# Patient Record
Sex: Female | Born: 1970 | Race: Black or African American | Hispanic: No | Marital: Single | Smoking: Former smoker
Health system: Southern US, Community
[De-identification: ages and names within clinical notes are randomized; demographics above are authoritative.]

## PROBLEM LIST (undated history)

## (undated) ENCOUNTER — Emergency Department (HOSPITAL_COMMUNITY)

## (undated) DIAGNOSIS — G9340 Encephalopathy, unspecified: Secondary | ICD-10-CM

## (undated) DIAGNOSIS — F209 Schizophrenia, unspecified: Secondary | ICD-10-CM

## (undated) DIAGNOSIS — E876 Hypokalemia: Secondary | ICD-10-CM

## (undated) DIAGNOSIS — F1021 Alcohol dependence, in remission: Secondary | ICD-10-CM

## (undated) DIAGNOSIS — I10 Essential (primary) hypertension: Secondary | ICD-10-CM

## (undated) DIAGNOSIS — R413 Other amnesia: Secondary | ICD-10-CM

## (undated) DIAGNOSIS — K7460 Unspecified cirrhosis of liver: Secondary | ICD-10-CM

## (undated) DIAGNOSIS — E119 Type 2 diabetes mellitus without complications: Secondary | ICD-10-CM

## (undated) DIAGNOSIS — D649 Anemia, unspecified: Secondary | ICD-10-CM

## (undated) HISTORY — PX: HIP SURGERY: SHX245

## (undated) HISTORY — DX: Anemia, unspecified: D64.9

## (undated) HISTORY — DX: Other amnesia: R41.3

---

## 2003-02-12 ENCOUNTER — Emergency Department (HOSPITAL_COMMUNITY): Admission: EM | Admit: 2003-02-12 | Discharge: 2003-02-12 | Payer: Self-pay | Admitting: Emergency Medicine

## 2003-07-01 ENCOUNTER — Emergency Department (HOSPITAL_COMMUNITY): Admission: EM | Admit: 2003-07-01 | Discharge: 2003-07-01 | Payer: Self-pay | Admitting: Emergency Medicine

## 2003-12-31 ENCOUNTER — Other Ambulatory Visit: Admission: RE | Admit: 2003-12-31 | Discharge: 2003-12-31 | Payer: Self-pay

## 2006-07-05 ENCOUNTER — Emergency Department (HOSPITAL_COMMUNITY): Admission: EM | Admit: 2006-07-05 | Discharge: 2006-07-05 | Payer: Self-pay | Admitting: Emergency Medicine

## 2010-01-11 ENCOUNTER — Emergency Department (HOSPITAL_COMMUNITY)
Admission: EM | Admit: 2010-01-11 | Discharge: 2010-01-11 | Payer: Self-pay | Source: Home / Self Care | Admitting: Emergency Medicine

## 2010-01-16 ENCOUNTER — Emergency Department (HOSPITAL_COMMUNITY)
Admission: EM | Admit: 2010-01-16 | Discharge: 2010-01-17 | Payer: Self-pay | Source: Home / Self Care | Admitting: Emergency Medicine

## 2010-03-25 ENCOUNTER — Other Ambulatory Visit: Payer: Self-pay

## 2010-03-25 DIAGNOSIS — Z139 Encounter for screening, unspecified: Secondary | ICD-10-CM

## 2010-03-30 ENCOUNTER — Ambulatory Visit (HOSPITAL_COMMUNITY)
Admission: RE | Admit: 2010-03-30 | Discharge: 2010-03-30 | Disposition: A | Discharge status: Home / Self Care | Payer: Medicaid Other | Source: Ambulatory Visit | Attending: Family Medicine | Admitting: Family Medicine

## 2010-03-30 DIAGNOSIS — Z139 Encounter for screening, unspecified: Secondary | ICD-10-CM

## 2010-03-30 DIAGNOSIS — Z1231 Encounter for screening mammogram for malignant neoplasm of breast: Secondary | ICD-10-CM | POA: Insufficient documentation

## 2010-03-30 LAB — COMPREHENSIVE METABOLIC PANEL WITH GFR
ALT: 63 U/L — ABNORMAL HIGH (ref 0–35)
BUN: <1 mg/dL — ABNORMAL LOW (ref 6–23)
CO2: 26 meq/L (ref 19–32)
Calcium: 8.2 mg/dL — ABNORMAL LOW (ref 8.4–10.5)
Creatinine, Ser: 0.59 mg/dL (ref 0.4–1.2)
GFR calc non Af Amer: >60 mL/min (ref >60)
Glucose, Bld: 93 mg/dL (ref 70–99)
Total Protein: 7.6 g/dL (ref 6.0–8.3)

## 2010-03-30 LAB — CBC
HCT: 36.8 % (ref 36.0–46.0)
Hemoglobin: 13.7 g/dL (ref 12.0–15.0)
MCH: 38.6 pg — ABNORMAL HIGH (ref 26.0–34.0)
MCH: 38.7 pg — ABNORMAL HIGH (ref 26.0–34.0)
MCHC: 37.2 g/dL — ABNORMAL HIGH (ref 30.0–36.0)
MCV: 104 fL — ABNORMAL HIGH (ref 78.0–100.0)
Platelets: 359 10*3/uL (ref 150–400)
Platelets: 397 10*3/uL (ref 150–400)
RBC: 3.37 MIL/uL — ABNORMAL LOW (ref 3.87–5.11)
RBC: 3.54 MIL/uL — ABNORMAL LOW (ref 3.87–5.11)
RDW: 15.6 % — ABNORMAL HIGH (ref 11.5–15.5)
WBC: 9.6 10*3/uL (ref 4.0–10.5)

## 2010-03-30 LAB — URINALYSIS, ROUTINE W REFLEX MICROSCOPIC
Bilirubin Urine: NEGATIVE
Glucose, UA: NEGATIVE mg/dL
Glucose, UA: NEGATIVE mg/dL
Ketones, ur: NEGATIVE mg/dL
Nitrite: NEGATIVE
Nitrite: NEGATIVE
Protein, ur: NEGATIVE mg/dL
Specific Gravity, Urine: 1.02 (ref 1.005–1.030)
Specific Gravity, Urine: <1.005 — ABNORMAL LOW (ref 1.005–1.030)
Urobilinogen, UA: 0.2 mg/dL (ref 0.0–1.0)
pH: 7 (ref 5.0–8.0)
pH: 7 (ref 5.0–8.0)

## 2010-03-30 LAB — DIFFERENTIAL
Basophils Absolute: 0 10*3/uL (ref 0.0–0.1)
Basophils Absolute: 0.1 10*3/uL (ref 0.0–0.1)
Basophils Relative: 0 % (ref 0–1)
Basophils Relative: 1 % (ref 0–1)
Eosinophils Absolute: 0 10*3/uL (ref 0.0–0.7)
Eosinophils Absolute: 0 K/uL (ref 0.0–0.7)
Eosinophils Relative: 0 % (ref 0–5)
Lymphocytes Relative: 32 % (ref 12–46)
Lymphs Abs: 1.7 10*3/uL (ref 0.7–4.0)
Lymphs Abs: 3.1 K/uL (ref 0.7–4.0)
Monocytes Absolute: 0.8 10*3/uL (ref 0.1–1.0)
Monocytes Relative: 8 % (ref 3–12)
Neutro Abs: 5.7 K/uL (ref 1.7–7.7)
Neutrophils Relative %: 59 % (ref 43–77)
Neutrophils Relative %: 75 % (ref 43–77)

## 2010-03-30 LAB — COMPREHENSIVE METABOLIC PANEL
AST: 128 U/L — ABNORMAL HIGH (ref 0–37)
Albumin: 4 g/dL (ref 3.5–5.2)
Alkaline Phosphatase: 62 U/L (ref 39–117)
Chloride: 92 mEq/L — ABNORMAL LOW (ref 96–112)
GFR calc Af Amer: >60 mL/min (ref >60)
Potassium: 2.7 mEq/L — CL (ref 3.5–5.1)
Sodium: 134 mEq/L — ABNORMAL LOW (ref 135–145)
Total Bilirubin: 0.9 mg/dL (ref 0.3–1.2)

## 2010-03-30 LAB — URINE MICROSCOPIC-ADD ON

## 2010-03-30 LAB — BASIC METABOLIC PANEL
CO2: 27 mEq/L (ref 19–32)
Calcium: 8.8 mg/dL (ref 8.4–10.5)
Creatinine, Ser: 0.7 mg/dL (ref 0.4–1.2)
GFR calc Af Amer: >60 mL/min (ref >60)

## 2010-09-28 ENCOUNTER — Encounter: Payer: Self-pay | Admitting: *Deleted

## 2010-09-28 ENCOUNTER — Emergency Department (HOSPITAL_COMMUNITY)
Admission: EM | Admit: 2010-09-28 | Discharge: 2010-09-28 | Disposition: A | Discharge status: Home / Self Care | Payer: Self-pay | Attending: Emergency Medicine | Admitting: Emergency Medicine

## 2010-09-28 DIAGNOSIS — F1010 Alcohol abuse, uncomplicated: Secondary | ICD-10-CM

## 2010-09-28 DIAGNOSIS — E876 Hypokalemia: Secondary | ICD-10-CM

## 2010-09-28 DIAGNOSIS — I10 Essential (primary) hypertension: Secondary | ICD-10-CM | POA: Insufficient documentation

## 2010-09-28 DIAGNOSIS — F17200 Nicotine dependence, unspecified, uncomplicated: Secondary | ICD-10-CM | POA: Insufficient documentation

## 2010-09-28 HISTORY — DX: Essential (primary) hypertension: I10

## 2010-09-28 HISTORY — DX: Hypokalemia: E87.6

## 2010-09-28 LAB — CBC
HCT: 38.1 % (ref 36.0–46.0)
Hemoglobin: 13.6 g/dL (ref 12.0–15.0)
MCHC: 35.7 g/dL (ref 30.0–36.0)
RBC: 3.72 MIL/uL — ABNORMAL LOW (ref 3.87–5.11)

## 2010-09-28 LAB — COMPREHENSIVE METABOLIC PANEL
Albumin: 3.8 g/dL (ref 3.5–5.2)
Alkaline Phosphatase: 56 U/L (ref 39–117)
BUN: 5 mg/dL — ABNORMAL LOW (ref 6–23)
CO2: 35 mEq/L — ABNORMAL HIGH (ref 19–32)
Chloride: 94 mEq/L — ABNORMAL LOW (ref 96–112)
Creatinine, Ser: 0.47 mg/dL — ABNORMAL LOW (ref 0.50–1.10)
GFR calc Af Amer: >60 mL/min (ref >60)
GFR calc non Af Amer: >60 mL/min (ref >60)
Glucose, Bld: 93 mg/dL (ref 70–99)
Potassium: 2.5 mEq/L — CL (ref 3.5–5.1)
Total Bilirubin: 3.5 mg/dL — ABNORMAL HIGH (ref 0.3–1.2)

## 2010-09-28 LAB — DIFFERENTIAL
Basophils Relative: 0 % (ref 0–1)
Lymphs Abs: 2.5 10*3/uL (ref 0.7–4.0)
Monocytes Absolute: 0.5 10*3/uL (ref 0.1–1.0)
Monocytes Relative: 7 % (ref 3–12)
Neutro Abs: 4.4 10*3/uL (ref 1.7–7.7)
Neutrophils Relative %: 59 % (ref 43–77)

## 2010-09-28 LAB — RAPID URINE DRUG SCREEN, HOSP PERFORMED
Barbiturates: NOT DETECTED
Cocaine: NOT DETECTED
Tetrahydrocannabinol: NOT DETECTED

## 2010-09-28 LAB — URINE MICROSCOPIC-ADD ON

## 2010-09-28 LAB — URINALYSIS, ROUTINE W REFLEX MICROSCOPIC
Nitrite: POSITIVE — AB
Specific Gravity, Urine: 1.01 (ref 1.005–1.030)
Urobilinogen, UA: 4 mg/dL — ABNORMAL HIGH (ref 0.0–1.0)

## 2010-09-28 LAB — ETHANOL: Alcohol, Ethyl (B): <11 mg/dL (ref 0–11)

## 2010-09-28 LAB — LIPASE, BLOOD: Lipase: 41 U/L (ref 11–59)

## 2010-09-28 MED ORDER — THIAMINE HCL 100 MG/ML IJ SOLN
Freq: Once | INTRAVENOUS | Status: DC
  Filled 2010-09-28: qty 1000

## 2010-09-28 MED ORDER — ONE-DAILY MULTI VITAMINS PO TABS: 1.0000 | ORAL_TABLET | Freq: Every day | ORAL | Status: DC

## 2010-09-28 MED ORDER — POTASSIUM CHLORIDE 20 MEQ PO PACK
40.0000 meq | PACK | Freq: Once | ORAL | Status: AC
  Administered 2010-09-28: 40 meq via ORAL
  Filled 2010-09-28: qty 2

## 2010-09-28 MED ORDER — POTASSIUM CHLORIDE ER 10 MEQ PO TBCR: 20.0000 meq | EXTENDED_RELEASE_TABLET | Freq: Two times a day (BID) | ORAL | Status: DC

## 2010-09-28 MED ORDER — LORAZEPAM 2 MG/ML IJ SOLN
1.0000 mg | Freq: Once | INTRAMUSCULAR | Status: AC
  Administered 2010-09-28: 1 mg via INTRAVENOUS
  Filled 2010-09-28: qty 1

## 2010-09-28 NOTE — ED Notes (Signed)
Numbness/tingling in both legs and states last night burning sensation "shot down spine".  Also c/o twitching.  States is stressed due to Grandmother being in hospice and has started drinking heavily.

## 2010-09-28 NOTE — ED Notes (Signed)
Pt ate and states she feels better. Pt states she is starting to feel drowsy. VSS. NAD at this time.

## 2010-09-28 NOTE — ED Provider Notes (Signed)
History   Chart scribed for Carleene Cooper III, MD by Enos Fling; the patient was seen in room APA19/APA19; this patient's care was started at 12:55 PM.    CSN: [id number] Arrival date & time: 09/28/2010 11:17 AM  Chief Complaint  Patient presents with  . Tingling   HPI Melinda Giles is a 40 y.o. female who presents to the Emergency Department complaining of nausea. Pt reports waking with nausea Saturday morning (2 days ago), much worse last night with visual hallucinations and feeling things are crawling on her. This has persisted today. Also c/o twitching as well as numbness and tingling in toes and hands since last night. Pt normally drinks 1/5 liquor every 2 days but has not had anything to drink since Friday night (3 days ago). Family report pt under a lot of stress d/t grandmother being in hospice.  Also reports HTN meds (atenolol) usually make her dizzy and PCP recently increased dose.    Past Medical History  Diagnosis Date  . Hypertension   . Hypokalemia     History reviewed. No pertinent past surgical history.  No family history on file.  History  Substance Use Topics  . Smoking status: Current Everyday Smoker  . Smokeless tobacco: Not on file  . Alcohol Use: Yes     drinks  1/5 of liquor every two days.  Smoker 0.5 ppd  OB History    Grav Para Term Preterm Abortions TAB SAB Ect Mult Living                 Previous Medications   ATENOLOL (TENORMIN) 50 MG TABLET    Take 50 mg by mouth daily.     CALCIUM-VITAMIN D (OSCAL WITH D) 500-200 MG-UNIT PER TABLET    Take 1 tablet by mouth daily.     LORATADINE (CLARITIN) 10 MG TABLET    Take 10 mg by mouth daily.     NORETHINDRONE (MICRONOR,CAMILA,ERRIN) 0.35 MG TABLET    Take 1 tablet by mouth daily.       Allergies as of 09/28/2010  . (No Known Allergies)     Review of Systems  Constitutional: Negative for fever, appetite change and unexpected weight change.  HENT: Negative for ear pain and sore throat.     Eyes: Negative for visual disturbance.  Respiratory: Negative for cough.   Cardiovascular: Negative for chest pain.  Gastrointestinal: Positive for nausea. Negative for vomiting, abdominal pain and diarrhea.  Genitourinary: Negative.   Musculoskeletal: Negative for myalgias.  Skin: Negative for rash.  Neurological: Positive for dizziness and numbness. Negative for seizures and syncope.  Hematological: Negative.   Psychiatric/Behavioral: Positive for hallucinations. The patient is nervous/anxious.     Physical Exam  BP 153/98  Pulse 87  Temp(Src) 98.9 F (37.2 C) (Oral)  Resp 19  Ht 5\' 4"  (1.626 m)  Wt 174 lb (78.926 kg)  BMI 29.87 kg/m2  SpO2 99%  Physical Exam  Nursing note and vitals reviewed. Constitutional: She is oriented to person, place, and time. She appears well-developed and well-nourished. No distress.  HENT:  Head: Normocephalic.  Mouth/Throat: Mucous membranes are normal.  Eyes: Conjunctivae are normal.  Neck: Normal range of motion. Neck supple.  Cardiovascular: Normal rate, regular rhythm and intact distal pulses.  Exam reveals no gallop and no friction rub.   No murmur heard. Pulmonary/Chest: Effort normal and breath sounds normal. She has no wheezes. She has no rales.  Abdominal: Soft. There is no tenderness.  Musculoskeletal: Normal range of  motion. She exhibits no edema and no tenderness.  Neurological: She is alert and oriented to person, place, and time.       Motor strength and extremity sensation intact  Skin: Skin is warm and dry. No rash noted.  Psychiatric: She has a normal mood and affect.    Procedures - none  OTHER DATA REVIEWED: Nursing notes and vital signs reviewed. Prior records reviewed.   LABS / RADIOLOGY: Results for orders placed during the hospital encounter of 09/28/10  CBC      Component Value Range   WBC 7.4  4.0 - 10.5 (K/uL)   RBC 3.72 (*) 3.87 - 5.11 (MIL/uL)   Hemoglobin 13.6  12.0 - 15.0 (g/dL)   HCT 03.4  74.2 -  59.5 (%)   MCV 102.4 (*) 78.0 - 100.0 (fL)   MCH 36.6 (*) 26.0 - 34.0 (pg)   MCHC 35.7  30.0 - 36.0 (g/dL)   RDW 63.8  75.6 - 43.3 (%)   Platelets 139 (*) 150 - 400 (K/uL)  DIFFERENTIAL      Component Value Range   Neutrophils Relative 59  43 - 77 (%)   Neutro Abs 4.4  1.7 - 7.7 (K/uL)   Lymphocytes Relative 33  12 - 46 (%)   Lymphs Abs 2.5  0.7 - 4.0 (K/uL)   Monocytes Relative 7  3 - 12 (%)   Monocytes Absolute 0.5  0.1 - 1.0 (K/uL)   Eosinophils Relative 1  0 - 5 (%)   Eosinophils Absolute 0.0  0.0 - 0.7 (K/uL)   Basophils Relative 0  0 - 1 (%)   Basophils Absolute 0.0  0.0 - 0.1 (K/uL)  COMPREHENSIVE METABOLIC PANEL      Component Value Range   Sodium 139  135 - 145 (mEq/L)   Potassium 2.5 (*) 3.5 - 5.1 (mEq/L)   Chloride 94 (*) 96 - 112 (mEq/L)   CO2 35 (*) 19 - 32 (mEq/L)   Glucose, Bld 93  70 - 99 (mg/dL)   BUN 5 (*) 6 - 23 (mg/dL)   Creatinine, Ser 2.95 (*) 0.50 - 1.10 (mg/dL)   Calcium 9.4  8.4 - 18.8 (mg/dL)   Total Protein 7.2  6.0 - 8.3 (g/dL)   Albumin 3.8  3.5 - 5.2 (g/dL)   AST 71 (*) 0 - 37 (U/L)   ALT 31  0 - 35 (U/L)   Alkaline Phosphatase 56  39 - 117 (U/L)   Total Bilirubin 3.5 (*) 0.3 - 1.2 (mg/dL)   GFR calc non Af Amer >60  >60 (mL/min)   GFR calc Af Amer >60  >60 (mL/min)  LIPASE, BLOOD      Component Value Range   Lipase 41  11 - 59 (U/L)  ETHANOL      Component Value Range   Alcohol, Ethyl (B) <11  0 - 11 (mg/dL)  URINALYSIS, ROUTINE W REFLEX MICROSCOPIC      Component Value Range   Color, Urine AMBER (*) YELLOW    Appearance CLEAR  CLEAR    Specific Gravity, Urine 1.010  1.005 - 1.030    pH 7.0  5.0 - 8.0    Glucose, UA 100 (*) NEGATIVE (mg/dL)   Hgb urine dipstick SMALL (*) NEGATIVE    Bilirubin Urine MODERATE (*) NEGATIVE    Ketones, ur TRACE (*) NEGATIVE (mg/dL)   Protein, ur 30 (*) NEGATIVE (mg/dL)   Urobilinogen, UA 4.0 (*) 0.0 - 1.0 (mg/dL)   Nitrite POSITIVE (*) NEGATIVE  Leukocytes, UA TRACE (*) NEGATIVE   URINE RAPID DRUG  SCREEN (HOSP PERFORMED)      Component Value Range   Opiates NONE DETECTED  NONE DETECTED    Cocaine NONE DETECTED  NONE DETECTED    Benzodiazepines NONE DETECTED  NONE DETECTED    Amphetamines NONE DETECTED  NONE DETECTED    Tetrahydrocannabinol NONE DETECTED  NONE DETECTED    Barbiturates NONE DETECTED  NONE DETECTED   URINE MICROSCOPIC-ADD ON      Component Value Range   Squamous Epithelial / LPF MANY (*) RARE    WBC, UA 7-10  <3 (WBC/hpf)   RBC / HPF 3-6  <3 (RBC/hpf)   Bacteria, UA FEW (*) RARE     ED COURSE: Patient had physical exam and laboratory testing, which showed hypokalemia, and mildly elevated liver function tests. I advised her that she would need to take potassium every day, and ordered 40 mEq of potassium orally now. She also needs to stop drinking, which is the source of her illness. She can take multivitamins every day. She did not want inpatient treatment for alcohol abuse. I referred her to [facility], for outpatient therapy.  IMPRESSION: 1. Alcohol abuse   2. Hypokalemia      PLAN:  All results reviewed and discussed with pt, questions answered, pt agreeable with plan.    MEDS GIVEN IN ED: sodium chloride 0.9 % 1,000 mL with thiamine 100 mg, folic acid 1 mg, multivitamins adult 10 mL infusion (  Intravenous New Bag 09/28/10 1418)  LORazepam (ATIVAN) injection 1 mg (1 mg Intravenous Given 09/28/10 1418)     SCRIBE ATTESTATION: I personally performed the services described in this documentation, which was scribed in my presence. The recorded information has been reviewed and considered. Osvaldo Human, MD        Carleene Cooper III, MD 09/28/10 [phone]

## 2011-08-06 ENCOUNTER — Encounter (HOSPITAL_COMMUNITY): Payer: Self-pay | Admitting: *Deleted

## 2011-08-06 ENCOUNTER — Emergency Department (HOSPITAL_COMMUNITY)
Admission: EM | Admit: 2011-08-06 | Discharge: 2011-08-06 | Disposition: A | Discharge status: Home / Self Care | Payer: Self-pay | Attending: Emergency Medicine | Admitting: Emergency Medicine

## 2011-08-06 DIAGNOSIS — Z79899 Other long term (current) drug therapy: Secondary | ICD-10-CM | POA: Insufficient documentation

## 2011-08-06 DIAGNOSIS — I10 Essential (primary) hypertension: Secondary | ICD-10-CM | POA: Insufficient documentation

## 2011-08-06 DIAGNOSIS — F1010 Alcohol abuse, uncomplicated: Secondary | ICD-10-CM | POA: Insufficient documentation

## 2011-08-06 DIAGNOSIS — E876 Hypokalemia: Secondary | ICD-10-CM | POA: Insufficient documentation

## 2011-08-06 DIAGNOSIS — Z046 Encounter for general psychiatric examination, requested by authority: Secondary | ICD-10-CM | POA: Insufficient documentation

## 2011-08-06 DIAGNOSIS — F17200 Nicotine dependence, unspecified, uncomplicated: Secondary | ICD-10-CM | POA: Insufficient documentation

## 2011-08-06 LAB — CBC WITH DIFFERENTIAL/PLATELET
Eosinophils Absolute: 0 10*3/uL (ref 0.0–0.7)
Lymphocytes Relative: 27 % (ref 12–46)
Lymphs Abs: 1.6 10*3/uL (ref 0.7–4.0)
MCH: 36.4 pg — ABNORMAL HIGH (ref 26.0–34.0)
Neutro Abs: 3.8 10*3/uL (ref 1.7–7.7)
Neutrophils Relative %: 63 % (ref 43–77)
Platelets: 104 10*3/uL — ABNORMAL LOW (ref 150–400)
RBC: 3.52 MIL/uL — ABNORMAL LOW (ref 3.87–5.11)
WBC: 6 10*3/uL (ref 4.0–10.5)

## 2011-08-06 LAB — BASIC METABOLIC PANEL
Chloride: 100 mEq/L (ref 96–112)
GFR calc Af Amer: >90 mL/min (ref >90)
GFR calc non Af Amer: >90 mL/min (ref >90)
Glucose, Bld: 98 mg/dL (ref 70–99)
Potassium: 2.5 mEq/L — CL (ref 3.5–5.1)
Sodium: 138 mEq/L (ref 135–145)

## 2011-08-06 LAB — URINALYSIS, ROUTINE W REFLEX MICROSCOPIC
Nitrite: NEGATIVE
Specific Gravity, Urine: <1.005 — ABNORMAL LOW (ref 1.005–1.030)
Urobilinogen, UA: 0.2 mg/dL (ref 0.0–1.0)
pH: 6 (ref 5.0–8.0)

## 2011-08-06 LAB — RAPID URINE DRUG SCREEN, HOSP PERFORMED
Amphetamines: NOT DETECTED
Barbiturates: NOT DETECTED
Benzodiazepines: NOT DETECTED

## 2011-08-06 LAB — POCT PREGNANCY, URINE: Preg Test, Ur: NEGATIVE

## 2011-08-06 LAB — ETHANOL: Alcohol, Ethyl (B): <11 mg/dL (ref 0–11)

## 2011-08-06 MED ORDER — POTASSIUM CHLORIDE CRYS ER 20 MEQ PO TBCR
40.0000 meq | EXTENDED_RELEASE_TABLET | Freq: Once | ORAL | Status: AC
  Administered 2011-08-06: 40 meq via ORAL
  Filled 2011-08-06: qty 2

## 2011-08-06 MED ORDER — POTASSIUM CHLORIDE CRYS ER 20 MEQ PO TBCR: 20.0000 meq | EXTENDED_RELEASE_TABLET | Freq: Two times a day (BID) | ORAL | Status: DC

## 2011-08-06 MED ORDER — POTASSIUM CHLORIDE 10 MEQ/100ML IV SOLN
10.0000 meq | Freq: Once | INTRAVENOUS | Status: AC
  Administered 2011-08-06: 10 meq via INTRAVENOUS
  Filled 2011-08-06: qty 100

## 2011-08-06 NOTE — ED Notes (Signed)
IVC here with police.  With papers.  Alert, cooperative.

## 2011-08-06 NOTE — ED Notes (Signed)
Pt very tearful and will not answer any questions.

## 2011-08-06 NOTE — ED Provider Notes (Signed)
History  This chart was scribed for Dione Booze, MD by Gerlean Ren. This patient was seen in room APA16A/APA16A and the patient's care was started at 2:06PM.  CSN: [id number]  Arrival date & time 08/06/11  1320   First MD Initiated Contact with Patient 08/06/11 1406      Chief Complaint  Patient presents with  . Medical Clearance     The history is provided by the patient. No language interpreter was used.    Melinda Giles is a 41 y.o. female who presents to the Emergency Department in the custody of RDCP for IVC for EtOH detox.  IVC papers state that mother wants pt committed for help with EtOH detox because she has been driving while intoxicated. Pt reports she consumes one pint in 3 days and states that her last alcoholic beverage was on 07/31/11. Pt states that she does not currently want help for alcohol abuse. She reports depression and reduced appetite, but denies suicidal thought or hallucinations. She also states she called [facility] to get outpatient help and was told that she would be contact within the next few days. She denies fever, neck pain, sore throat, visual disturbance, CP, cough, SOB, abdominal pain, nausea, emesis, diarrhea, urinary symptoms, back pain, HA, weakness, numbness and rash as associated symptoms.  She has h/o HTN. Pt reports tobacco use (.5pack/day).  Pt denies drug use.  Past Medical History  Diagnosis Date  . Hypertension   . Hypokalemia     History reviewed. No pertinent past surgical history.  History reviewed. No pertinent family history.  History  Substance Use Topics  . Smoking status: Current Everyday Smoker  . Smokeless tobacco: Not on file  . Alcohol Use: Yes     drinks  1/5 of liquor every two days.    No OB history provided.  Review of Systems  Psychiatric/Behavioral: Positive for disturbed wake/sleep cycle. Negative for suicidal ideas, hallucinations and self-injury.  All other systems reviewed and are negative.    Allergies    Other  Home Medications   Current Outpatient Rx  Name Route Sig Dispense Refill  . ATENOLOL 50 MG PO TABS Oral Take 50 mg by mouth daily.      Marland Kitchen CALCIUM CARBONATE-VITAMIN D 500-200 MG-UNIT PO TABS Oral Take 1 tablet by mouth daily.      Marland Kitchen LORATADINE 10 MG PO TABS Oral Take 10 mg by mouth daily.      Marland Kitchen ONE-DAILY MULTI VITAMINS PO TABS Oral Take 1 tablet by mouth daily. 30 tablet 2  . NORETHINDRONE 0.35 MG PO TABS Oral Take 1 tablet by mouth daily.      Marland Kitchen POTASSIUM CHLORIDE ER 10 MEQ PO TBCR Oral Take 2 tablets (20 mEq total) by mouth 2 (two) times daily. 30 tablet 0    Triage vitals: BP 145/105  Pulse 87  Temp 99.8 F (37.7 C) (Oral)  Resp 20  Ht 5\' 5"  (1.651 m)  Wt 170 lb (77.111 kg)  BMI 28.29 kg/m2  SpO2 100%  Physical Exam  Constitutional: She is oriented to person, place, and time. She appears well-developed and well-nourished.  HENT:  Head: Normocephalic and atraumatic.  Neck: Normal range of motion. No tracheal deviation present.  Cardiovascular: Normal rate, regular rhythm and normal heart sounds.   Pulmonary/Chest: Effort normal and breath sounds normal. No respiratory distress.  Abdominal: Soft. Bowel sounds are normal. There is no tenderness.  Musculoskeletal: Normal range of motion.  Neurological: She is alert and oriented to  person, place, and time.  Skin: Skin is warm and dry.  Psychiatric: She has a normal mood and affect. Her behavior is normal.    ED Course  Procedures (including critical care time)  DIAGNOSTIC STUDIES: Oxygen Saturation is 100% on room air, normal by my interpretation.    COORDINATION OF CARE: 2:16PM- Described need for blood work and urinalysis, but further described counseling options for alcohol problems. Informed pt that she does not meet requirement for IVC.  Results for orders placed during the hospital encounter of 08/06/11  CBC WITH DIFFERENTIAL      Component Value Range   WBC 6.0  4.0 - 10.5 K/uL   RBC 3.52 (*) 3.87 -  5.11 MIL/uL   Hemoglobin 12.8  12.0 - 15.0 g/dL   HCT 16.1  09.6 - 04.5 %   MCV 102.3 (*) 78.0 - 100.0 fL   MCH 36.4 (*) 26.0 - 34.0 pg   MCHC 35.6  30.0 - 36.0 g/dL   RDW 40.9  81.1 - 91.4 %   Platelets 104 (*) 150 - 400 K/uL   Neutrophils Relative 63  43 - 77 %   Neutro Abs 3.8  1.7 - 7.7 K/uL   Lymphocytes Relative 27  12 - 46 %   Lymphs Abs 1.6  0.7 - 4.0 K/uL   Monocytes Relative 9  3 - 12 %   Monocytes Absolute 0.5  0.1 - 1.0 K/uL   Eosinophils Relative 1  0 - 5 %   Eosinophils Absolute 0.0  0.0 - 0.7 K/uL   Basophils Relative 1  0 - 1 %   Basophils Absolute 0.0  0.0 - 0.1 K/uL  BASIC METABOLIC PANEL      Component Value Range   Sodium 138  135 - 145 mEq/L   Potassium 2.5 (*) 3.5 - 5.1 mEq/L   Chloride 100  96 - 112 mEq/L   CO2 29  19 - 32 mEq/L   Glucose, Bld 98  70 - 99 mg/dL   BUN 3 (*) 6 - 23 mg/dL   Creatinine, Ser 7.82  0.50 - 1.10 mg/dL   Calcium 9.4  8.4 - 95.6 mg/dL   GFR calc non Af Amer >90  >90 mL/min   GFR calc Af Amer >90  >90 mL/min  URINALYSIS, ROUTINE W REFLEX MICROSCOPIC      Component Value Range   Color, Urine YELLOW  YELLOW   APPearance CLEAR  CLEAR   Specific Gravity, Urine <1.005 (*) 1.005 - 1.030   pH 6.0  5.0 - 8.0   Glucose, UA NEGATIVE  NEGATIVE mg/dL   Hgb urine dipstick TRACE (*) NEGATIVE   Bilirubin Urine NEGATIVE  NEGATIVE   Ketones, ur NEGATIVE  NEGATIVE mg/dL   Protein, ur NEGATIVE  NEGATIVE mg/dL   Urobilinogen, UA 0.2  0.0 - 1.0 mg/dL   Nitrite NEGATIVE  NEGATIVE   Leukocytes, UA NEGATIVE  NEGATIVE  ETHANOL      Component Value Range   Alcohol, Ethyl (B) <11  0 - 11 mg/dL  POCT PREGNANCY, URINE      Component Value Range   Preg Test, Ur NEGATIVE  NEGATIVE  URINE MICROSCOPIC-ADD ON      Component Value Range   Squamous Epithelial / LPF MANY (*) RARE   WBC, UA 0-2  <3 WBC/hpf   RBC / HPF 0-2  <3 RBC/hpf   Bacteria, UA FEW (*) RARE  URINE RAPID DRUG SCREEN (HOSP PERFORMED)      Component Value  Range   Opiates NONE  DETECTED  NONE DETECTED   Cocaine NONE DETECTED  NONE DETECTED   Benzodiazepines NONE DETECTED  NONE DETECTED   Amphetamines NONE DETECTED  NONE DETECTED   Tetrahydrocannabinol NONE DETECTED  NONE DETECTED   Barbiturates NONE DETECTED  NONE DETECTED     1. Alcohol abuse   2. Hypokalemia       MDM  Alcohol abuse. I reviewed the involuntary commitment papers. She does not meet any of the criteria for involuntary commitment. She has no suicidal or homicidal ideation. Although she has done some things which are potentially self-destructive, she is not an imminent threat to herself. She denies any desire to go through alcohol detox at this time. ACT Team will be consulted to give resources for her should she ever desire to go through detox.   Potassium is come back very low. She will be given IV and oral potassium. Curiously, and she was noted to have a very low potassium in the past. She is not on any diuretics, I do not see any evidence of renal tubular acidosis. For now, potassium will be added treated with potassium supplements, but she may need further workup of this.  I personally performed the services described in this documentation, which was scribed in my presence. The recorded information has been reviewed and considered.  ACT Team states that her only option, given her insurance status, is to followup with [facility].  Dione Booze, MD 08/06/11 1726

## 2011-08-06 NOTE — BH Assessment (Signed)
Assessment Note   Melinda Giles is an 41 y.o. female. PT WAS BROUGHT IN BY [facility] AFTER PT WAS PETITIONED BY HER MOM DUE TO HER DRINKING. PT DENIES ANY IDEATION & HAS EXPRESSED THAT SHE WAS NOT READY TO GET TX. PT'S PETITION WAS RESINDED & PT WAS DISCHARGED HOME TO FOLLOW UP WITH [facility] IN [place]. PT IS ABLE TO CONTRACT FOR SAFETY.  Axis I: Alcohol Abuse and Substance Induced Mood Disorder Axis II: Deferred Axis III:  Past Medical History  Diagnosis Date  . Hypertension   . Hypokalemia    Axis IV: other psychosocial or environmental problems, problems related to social environment and problems with primary support group Axis V: 51-60 moderate symptoms  Past Medical History:  Past Medical History  Diagnosis Date  . Hypertension   . Hypokalemia     History reviewed. No pertinent past surgical history.  Family History: History reviewed. No pertinent family history.  Social History:  reports that she has been smoking.  She does not have any smokeless tobacco history on file. She reports that she drinks alcohol. She reports that she does not use illicit drugs.  Additional Social History:     CIWA: CIWA-Ar BP: 145/105 mmHg Pulse Rate: 87  COWS:    Allergies:  Allergies  Allergen Reactions  . Other Anaphylaxis and Swelling    "pril " family of bp meds    Home Medications:  (Not in a hospital admission)  OB/GYN Status:  No LMP recorded. Patient is not currently having periods (Reason: Oral contraceptives).  General Assessment Data Location of Assessment: [facility] ACT Assessment: Yes Living Arrangements: Parent Can pt return to current living arrangement?: Yes Admission Status: Involuntary Is patient capable of signing voluntary admission?: Yes Transfer from: Acute Hospital Referral Source: MD     Risk to self Suicidal Ideation: No Suicidal Intent: No Is patient at risk for suicide?: No Suicidal Plan?: No Access to Means: No What has been your use of  drugs/alcohol within the last 12 months?: PT ADMIT TO ABUSING ETOH & LAST USE WAS TODAY Previous Attempts/Gestures: No How many times?: 0  Other Self Harm Risks: SELF MEDICATING Triggers for Past Attempts: Family contact;Unpredictable Intentional Self Injurious Behavior: None Family Suicide History: No Recent stressful life event(s): Conflict (Comment);Financial Problems;Turmoil (Comment) Persecutory voices/beliefs?: No Depression: Yes Depression Symptoms: Loss of interest in usual pleasures Substance abuse history and/or treatment for substance abuse?: Yes Suicide prevention information given to non-admitted patients: Not applicable  Risk to Others Homicidal Ideation: No Thoughts of Harm to Others: No Current Homicidal Intent: No Current Homicidal Plan: No Access to Homicidal Means: No Identified Victim: NA History of harm to others?: No Assessment of Violence: None Noted Violent Behavior Description: COOPERATIVE, ANXIOUS Does patient have access to weapons?: No Criminal Charges Pending?: No Does patient have a court date: No  Psychosis Hallucinations: None noted Delusions: None noted  Mental Status Report Appear/Hygiene: Disheveled;Body odor;Poor hygiene Eye Contact: Fair Motor Activity: Freedom of movement Speech: Logical/coherent Level of Consciousness: Alert Mood: Depressed;Anxious;Anhedonia Affect: Appropriate to circumstance Anxiety Level: Minimal Thought Processes: Coherent;Relevant Judgement: Impaired Orientation: Person;Place;Time;Situation Obsessive Compulsive Thoughts/Behaviors: None  Cognitive Functioning Concentration: Decreased Memory: Recent Intact;Remote Intact IQ: Average Insight: Poor Impulse Control: Poor Appetite: Poor Weight Loss: 0  Weight Gain: 0  Sleep: Decreased Total Hours of Sleep: 2  Vegetative Symptoms: None  ADLScreening [facility] Assessment Services) Patient's cognitive ability adequate to safely complete daily activities?:  Yes Patient able to express need for assistance with ADLs?: Yes  Independently performs ADLs?: Yes  Abuse/Neglect [facility]) Physical Abuse: Denies Verbal Abuse: Denies Sexual Abuse: Denies  Prior Inpatient Therapy Prior Inpatient Therapy: No Prior Therapy Dates: NA Prior Therapy Facilty/Provider(s): NA Reason for Treatment: NA  Prior Outpatient Therapy Prior Outpatient Therapy: No Prior Therapy Dates: NA Prior Therapy Facilty/Provider(s): NA Reason for Treatment: NA  ADL Screening (condition at time of admission) Patient's cognitive ability adequate to safely complete daily activities?: Yes Patient able to express need for assistance with ADLs?: Yes Independently performs ADLs?: Yes       Abuse/Neglect Assessment (Assessment to be complete while patient is alone) Physical Abuse: Denies Verbal Abuse: Denies Sexual Abuse: Denies Values / Beliefs Cultural Requests During Hospitalization: None Spiritual Requests During Hospitalization: None        Additional Information 1:1 In Past 12 Months?: No Elopement Risk: No Does patient have medical clearance?: Yes     Disposition:  Disposition Disposition of Patient: Outpatient treatment Type of outpatient treatment: Adult  On Site Evaluation by:   Reviewed with Physician:     Waldron Session 08/06/2011 8:39 PM

## 2011-08-06 NOTE — ED Notes (Signed)
CRITICAL VALUE ALERT  Critical value received:  Potassium 2.5  Date of notification:  08/06/2011  Time of notification:  1534  Critical value read back: yes  Nurse who received alert: Garrison Columbus, RN  MD notified (1st page): Dr. Preston Fleeting  Time of first page: 1534  MD notified (2nd page):  Time of second page:  Responding MD:  Dr. Preston Fleeting   Time MD responded:  [phone]

## 2011-10-29 ENCOUNTER — Other Ambulatory Visit (HOSPITAL_COMMUNITY): Payer: Self-pay | Admitting: Nurse Practitioner

## 2011-10-29 DIAGNOSIS — Z139 Encounter for screening, unspecified: Secondary | ICD-10-CM

## 2011-11-04 ENCOUNTER — Ambulatory Visit (HOSPITAL_COMMUNITY)
Admission: RE | Admit: 2011-11-04 | Discharge: 2011-11-04 | Disposition: A | Discharge status: Home / Self Care | Payer: Self-pay | Source: Ambulatory Visit | Attending: Nurse Practitioner | Admitting: Nurse Practitioner

## 2011-11-04 DIAGNOSIS — Z139 Encounter for screening, unspecified: Secondary | ICD-10-CM

## 2013-02-15 ENCOUNTER — Other Ambulatory Visit (HOSPITAL_COMMUNITY): Payer: Self-pay | Admitting: *Deleted

## 2013-02-15 DIAGNOSIS — Z1231 Encounter for screening mammogram for malignant neoplasm of breast: Secondary | ICD-10-CM

## 2013-02-26 ENCOUNTER — Ambulatory Visit (HOSPITAL_COMMUNITY)
Admission: RE | Admit: 2013-02-26 | Discharge: 2013-02-26 | Disposition: A | Discharge status: Home / Self Care | Payer: Self-pay | Source: Ambulatory Visit | Attending: *Deleted | Admitting: *Deleted

## 2013-02-26 DIAGNOSIS — Z1231 Encounter for screening mammogram for malignant neoplasm of breast: Secondary | ICD-10-CM

## 2014-11-19 ENCOUNTER — Other Ambulatory Visit (HOSPITAL_COMMUNITY): Payer: Self-pay | Admitting: *Deleted

## 2014-11-19 DIAGNOSIS — Z1231 Encounter for screening mammogram for malignant neoplasm of breast: Secondary | ICD-10-CM

## 2014-12-02 ENCOUNTER — Ambulatory Visit (HOSPITAL_COMMUNITY)
Admission: RE | Admit: 2014-12-02 | Discharge: 2014-12-02 | Disposition: A | Discharge status: Home / Self Care | Payer: PRIVATE HEALTH INSURANCE | Source: Ambulatory Visit | Attending: *Deleted | Admitting: *Deleted

## 2014-12-02 DIAGNOSIS — Z1231 Encounter for screening mammogram for malignant neoplasm of breast: Secondary | ICD-10-CM

## 2015-03-13 ENCOUNTER — Ambulatory Visit: Payer: Self-pay | Admitting: Physician Assistant

## 2015-03-17 ENCOUNTER — Encounter: Payer: Self-pay | Admitting: Physician Assistant

## 2015-03-18 ENCOUNTER — Encounter: Payer: Self-pay | Admitting: Physician Assistant

## 2015-12-01 ENCOUNTER — Other Ambulatory Visit (HOSPITAL_COMMUNITY): Payer: Self-pay | Admitting: Nurse Practitioner

## 2015-12-01 DIAGNOSIS — Z1231 Encounter for screening mammogram for malignant neoplasm of breast: Secondary | ICD-10-CM

## 2015-12-19 ENCOUNTER — Encounter (HOSPITAL_COMMUNITY): Payer: Self-pay | Admitting: Radiology

## 2015-12-19 ENCOUNTER — Ambulatory Visit (HOSPITAL_COMMUNITY)
Admission: RE | Admit: 2015-12-19 | Discharge: 2015-12-19 | Disposition: A | Discharge status: Home / Self Care | Payer: Self-pay | Source: Ambulatory Visit | Attending: Nurse Practitioner | Admitting: Nurse Practitioner

## 2015-12-19 DIAGNOSIS — Z1231 Encounter for screening mammogram for malignant neoplasm of breast: Secondary | ICD-10-CM

## 2016-12-21 ENCOUNTER — Other Ambulatory Visit: Payer: Self-pay | Admitting: Nurse Practitioner

## 2016-12-21 DIAGNOSIS — Z1231 Encounter for screening mammogram for malignant neoplasm of breast: Secondary | ICD-10-CM

## 2017-03-17 ENCOUNTER — Other Ambulatory Visit (HOSPITAL_COMMUNITY): Payer: Self-pay | Admitting: *Deleted

## 2017-03-17 DIAGNOSIS — Z1231 Encounter for screening mammogram for malignant neoplasm of breast: Secondary | ICD-10-CM

## 2017-03-23 ENCOUNTER — Ambulatory Visit (HOSPITAL_COMMUNITY)
Admission: RE | Admit: 2017-03-23 | Discharge: 2017-03-23 | Disposition: A | Discharge status: Home / Self Care | Payer: PRIVATE HEALTH INSURANCE | Source: Ambulatory Visit | Attending: *Deleted | Admitting: *Deleted

## 2017-03-23 DIAGNOSIS — Z1231 Encounter for screening mammogram for malignant neoplasm of breast: Secondary | ICD-10-CM | POA: Diagnosis present

## 2017-10-15 ENCOUNTER — Other Ambulatory Visit: Payer: Self-pay

## 2017-10-15 ENCOUNTER — Emergency Department (HOSPITAL_COMMUNITY)
Admission: EM | Admit: 2017-10-15 | Discharge: 2017-10-15 | Disposition: A | Discharge status: Home / Self Care | Payer: Medicaid Other | Attending: Emergency Medicine | Admitting: Emergency Medicine

## 2017-10-15 ENCOUNTER — Encounter (HOSPITAL_COMMUNITY): Payer: Self-pay | Admitting: Emergency Medicine

## 2017-10-15 DIAGNOSIS — F17210 Nicotine dependence, cigarettes, uncomplicated: Secondary | ICD-10-CM | POA: Insufficient documentation

## 2017-10-15 DIAGNOSIS — Z79899 Other long term (current) drug therapy: Secondary | ICD-10-CM | POA: Insufficient documentation

## 2017-10-15 DIAGNOSIS — E876 Hypokalemia: Secondary | ICD-10-CM | POA: Insufficient documentation

## 2017-10-15 DIAGNOSIS — R202 Paresthesia of skin: Secondary | ICD-10-CM

## 2017-10-15 DIAGNOSIS — Z91010 Allergy to peanuts: Secondary | ICD-10-CM | POA: Insufficient documentation

## 2017-10-15 DIAGNOSIS — I10 Essential (primary) hypertension: Secondary | ICD-10-CM | POA: Insufficient documentation

## 2017-10-15 LAB — MAGNESIUM: MAGNESIUM: 1.4 mg/dL — AB (ref 1.7–2.4)

## 2017-10-15 LAB — COMPREHENSIVE METABOLIC PANEL
ALBUMIN: 3.1 g/dL — AB (ref 3.5–5.0)
ALT: 33 U/L (ref 0–44)
ANION GAP: 10 (ref 5–15)
AST: 77 U/L — AB (ref 15–41)
Alkaline Phosphatase: 69 U/L (ref 38–126)
CHLORIDE: 93 mmol/L — AB (ref 98–111)
CO2: 29 mmol/L (ref 22–32)
Calcium: 8.6 mg/dL — ABNORMAL LOW (ref 8.9–10.3)
Creatinine, Ser: 0.72 mg/dL (ref 0.44–1.00)
GFR calc Af Amer: >60 mL/min (ref >60)
GLUCOSE: 150 mg/dL — AB (ref 70–99)
POTASSIUM: 3.1 mmol/L — AB (ref 3.5–5.1)
Sodium: 132 mmol/L — ABNORMAL LOW (ref 135–145)
Total Bilirubin: 1.8 mg/dL — ABNORMAL HIGH (ref 0.3–1.2)
Total Protein: 8.2 g/dL — ABNORMAL HIGH (ref 6.5–8.1)

## 2017-10-15 LAB — CBC WITH DIFFERENTIAL/PLATELET
Basophils Absolute: 0 10*3/uL (ref 0.0–0.1)
Basophils Relative: 0 %
EOS PCT: 0 %
Eosinophils Absolute: 0 10*3/uL (ref 0.0–0.7)
HEMATOCRIT: 34.5 % — AB (ref 36.0–46.0)
HEMOGLOBIN: 12.3 g/dL (ref 12.0–15.0)
LYMPHS PCT: 24 %
Lymphs Abs: 3.5 10*3/uL (ref 0.7–4.0)
MCH: 45.1 pg — ABNORMAL HIGH (ref 26.0–34.0)
MCHC: 35.7 g/dL (ref 30.0–36.0)
MCV: 126.4 fL — ABNORMAL HIGH (ref 78.0–100.0)
MONOS PCT: 6 %
Monocytes Absolute: 0.9 10*3/uL (ref 0.1–1.0)
NEUTROS PCT: 70 %
Neutro Abs: 10.1 10*3/uL — ABNORMAL HIGH (ref 1.7–7.7)
Platelets: 513 10*3/uL — ABNORMAL HIGH (ref 150–400)
RBC: 2.73 MIL/uL — AB (ref 3.87–5.11)
RDW: 17.9 % — ABNORMAL HIGH (ref 11.5–15.5)
WBC: 14.5 10*3/uL — ABNORMAL HIGH (ref 4.0–10.5)

## 2017-10-15 LAB — INFLUENZA PANEL BY PCR (TYPE A & B)
INFLBPCR: NEGATIVE
Influenza A By PCR: NEGATIVE

## 2017-10-15 MED ORDER — MAGNESIUM 250 MG PO TABS: 1.0000 | ORAL_TABLET | Freq: Two times a day (BID) | ORAL | 0 refills | Status: DC

## 2017-10-15 MED ORDER — POTASSIUM CHLORIDE CRYS ER 20 MEQ PO TBCR: 20.0000 meq | EXTENDED_RELEASE_TABLET | Freq: Two times a day (BID) | ORAL | 0 refills | Status: DC

## 2017-10-15 MED ORDER — MAGNESIUM GLUCONATE 500 MG PO TABS
500.0000 mg | ORAL_TABLET | Freq: Once | ORAL | Status: AC
  Administered 2017-10-15: 500 mg via ORAL
  Filled 2017-10-15: qty 1

## 2017-10-15 MED ORDER — POTASSIUM CHLORIDE CRYS ER 20 MEQ PO TBCR
40.0000 meq | EXTENDED_RELEASE_TABLET | Freq: Once | ORAL | Status: AC
  Administered 2017-10-15: 40 meq via ORAL
  Filled 2017-10-15: qty 2

## 2017-10-15 NOTE — ED Triage Notes (Signed)
Patient c/o numbness in hands that started 4 days ago and is progressively getting worse. Per patient lips and tongue feels numb now. Denies any injury. Per patient thought it was due to arthritis. Patient taking BC arthritis powder with no relief. Patient states skin is burning when she rubs her hands together and she now has generalized body aches. No hx of diabetes.

## 2017-10-15 NOTE — ED Provider Notes (Signed)
[facility] EMERGENCY DEPARTMENT Provider Note   CSN: [id number] Arrival date & time: 10/15/17  1449     History   Chief Complaint Chief Complaint  Patient presents with  . Numbness    HPI Melinda Giles is a 47 y.o. female.  HPI   She presents for evaluation of numbness of lips, tongue, hands.  She also complains of generalized achiness, and trouble walking because being off balance.  Does present at least 1 week and have occurred previously when her potassium and magnesium levels were low.  She denies headache, chest pain, cough, shortness of breath, nausea, vomiting, focal weakness.  She is taking over-the-counter potassium, twice a day.  She is not currently taking magnesium.  She has not seen her doctor recently.  She was on Norvasc for about a week in July 2019 but stopped because it was making her ankle swell.  She has not had her blood pressure checked since that time.  She continues to take Tenormin for blood pressure.  There are no other known modifying factors.  Past Medical History:  Diagnosis Date  . Hypertension   . Hypokalemia     There are no active problems to display for this patient.   History reviewed. No pertinent surgical history.   OB History   None      Home Medications    Prior to Admission medications   Medication Sig Start Date End Date Taking? Authorizing Provider  atenolol (TENORMIN) 50 MG tablet Take 25 mg by mouth daily.     [provider]  Magnesium 250 MG TABS Take 1 tablet (250 mg total) by mouth 2 (two) times daily. 10/15/17   Mancel Bale, MD  Multiple Vitamin (MULTIVITAMIN) tablet Take 1 tablet by mouth daily. 09/28/10   Carleene Cooper, MD  norethindrone (MICRONOR,CAMILA,ERRIN) 0.35 MG tablet Take 1 tablet by mouth daily.      [provider]  potassium chloride SA (K-DUR,KLOR-CON) 20 MEQ tablet Take 1 tablet (20 mEq total) by mouth 2 (two) times daily. 10/15/17 10/15/18  Mancel Bale, MD    Family History No  family history on file.  Social History Social History   Tobacco Use  . Smoking status: Current Every Day Smoker    Packs/day: 0.50    Years: 30.00    Pack years: 15.00    Types: Cigarettes  . Smokeless tobacco: Never Used  Substance Use Topics  . Alcohol use: Yes    Comment: drinks  1/5 of liquor every two days.  . Drug use: No     Allergies   Other and Peanut-containing drug products   Review of Systems Review of Systems  All other systems reviewed and are negative.    Physical Exam Updated Vital Signs BP (!) 135/93   Pulse 76   Temp 98.1 F (36.7 C) (Oral)   Resp 16   Ht 5\' 6"  (1.676 m)   Wt 113.4 kg   LMP 07/30/2017   SpO2 100%   BMI 40.35 kg/m   Physical Exam  Constitutional: She is oriented to person, place, and time. She appears well-developed and well-nourished. No distress.  HENT:  Head: Normocephalic and atraumatic.  Eyes: Pupils are equal, round, and reactive to light. Conjunctivae and EOM are normal.  Neck: Normal range of motion and phonation normal. Neck supple.  Cardiovascular: Normal rate and regular rhythm.  Pulmonary/Chest: Effort normal and breath sounds normal. She exhibits no tenderness.  Abdominal: Soft. She exhibits no distension. There is no tenderness.  There is no guarding.  Musculoskeletal: Normal range of motion.  Neurological: She is alert and oriented to person, place, and time. She exhibits normal muscle tone.  Intact light touch sensation face, hands and arms.  No dysarthria, aphasia or ataxia.  Skin: Skin is warm and dry.  Psychiatric: She has a normal mood and affect. Her behavior is normal. Judgment and thought content normal.  Nursing note and vitals reviewed.    ED Treatments / Results  Labs (all labs ordered are listed, but only abnormal results are displayed) Labs Reviewed  CBC WITH DIFFERENTIAL/PLATELET - Abnormal; Notable for the following components:      Result Value   WBC 14.5 (*)    RBC 2.73 (*)    HCT  34.5 (*)    MCV 126.4 (*)    MCH 45.1 (*)    RDW 17.9 (*)    Platelets 513 (*)    Neutro Abs 10.1 (*)    All other components within normal limits  COMPREHENSIVE METABOLIC PANEL - Abnormal; Notable for the following components:   Sodium 132 (*)    Potassium 3.1 (*)    Chloride 93 (*)    Glucose, Bld 150 (*)    BUN <5 (*)    Calcium 8.6 (*)    Total Protein 8.2 (*)    Albumin 3.1 (*)    AST 77 (*)    Total Bilirubin 1.8 (*)    All other components within normal limits  MAGNESIUM - Abnormal; Notable for the following components:   Magnesium 1.4 (*)    All other components within normal limits  INFLUENZA PANEL BY PCR (TYPE A & B)    EKG None  Radiology No results found.  Procedures Procedures (including critical care time)  Medications Ordered in ED Medications  potassium chloride SA (K-DUR,KLOR-CON) CR tablet 40 mEq (40 mEq Oral Given 10/15/17 1842)  magnesium gluconate (MAGONATE) tablet 500 mg (500 mg Oral Given 10/15/17 1907)     Initial Impression / Assessment and Plan / ED Course  I have reviewed the triage vital signs and the nursing notes.  Pertinent labs & imaging results that were available during my care of the patient were reviewed by me and considered in my medical decision making (see chart for details).      Patient Vitals for the past 24 hrs:  BP Temp Temp src Pulse Resp SpO2 Height Weight  10/15/17 1900 (!) 135/93 - - 76 - 100 % - -  10/15/17 1843 (!) 141/100 - - 87 - 95 % - -  10/15/17 1534 (!) 130/101 98.1 F (36.7 C) Oral 87 16 100 % 5\' 6"  (1.676 m) 113.4 kg    7:37 PM Reevaluation with update and discussion. After initial assessment and treatment, an updated evaluation reveals she is comfortable now has no further complaints.  Findings discussed and questions answered. Mancel Bale   Medical Decision Making: Nonspecific tingling, with hypomagnesemia and hypokalemia.  Doubt CVA, spinal myelopathy or impending vascular collapse.  CRITICAL  CARE-no Performed by: Mancel Bale  Nursing Notes Reviewed/ Care Coordinated Applicable Imaging Reviewed Interpretation of Laboratory Data incorporated into ED treatment  The patient appears reasonably screened and/or stabilized for discharge and I doubt any other medical condition or other [facility] requiring further screening, evaluation, or treatment in the ED at this time prior to discharge.  Plan: Home Medications-change potassium to higher dose, continue other medications; Home Treatments-rest, fluids; return here if the recommended treatment, does not improve the symptoms; Recommended  follow up-PCP follow-up 1 week and as needed    Final Clinical Impressions(s) / ED Diagnoses   Final diagnoses:  Hypokalemia  Hypomagnesemia    ED Discharge Orders         Ordered    potassium chloride SA (K-DUR,KLOR-CON) 20 MEQ tablet  2 times daily     10/15/17 1937    Magnesium 250 MG TABS  2 times daily     10/15/17 1937           Mancel Bale, MD 10/15/17 [phone]

## 2017-10-15 NOTE — Discharge Instructions (Addendum)
Try to eat foods which contain more potassium.  Make sure you are getting plenty of rest and drinking a lot of fluids.  We sent prescriptions to your pharmacy to start taking to treat the low potassium.  See your doctor for checkup next week for further testing and treatment.

## 2017-10-15 NOTE — ED Notes (Signed)
Food and water given to patient.

## 2017-10-20 ENCOUNTER — Emergency Department (HOSPITAL_COMMUNITY): Payer: Medicaid Other

## 2017-10-20 ENCOUNTER — Emergency Department (HOSPITAL_COMMUNITY)
Admission: EM | Admit: 2017-10-20 | Discharge: 2017-10-20 | Disposition: A | Discharge status: Home / Self Care | Payer: Medicaid Other | Attending: Emergency Medicine | Admitting: Emergency Medicine

## 2017-10-20 ENCOUNTER — Other Ambulatory Visit: Payer: Self-pay

## 2017-10-20 ENCOUNTER — Encounter (HOSPITAL_COMMUNITY): Payer: Self-pay | Admitting: Emergency Medicine

## 2017-10-20 DIAGNOSIS — Z79899 Other long term (current) drug therapy: Secondary | ICD-10-CM | POA: Insufficient documentation

## 2017-10-20 DIAGNOSIS — F17210 Nicotine dependence, cigarettes, uncomplicated: Secondary | ICD-10-CM | POA: Insufficient documentation

## 2017-10-20 DIAGNOSIS — R270 Ataxia, unspecified: Secondary | ICD-10-CM

## 2017-10-20 DIAGNOSIS — I10 Essential (primary) hypertension: Secondary | ICD-10-CM | POA: Insufficient documentation

## 2017-10-20 LAB — BASIC METABOLIC PANEL
ANION GAP: 11 (ref 5–15)
BUN: 7 mg/dL (ref 6–20)
CHLORIDE: 101 mmol/L (ref 98–111)
CO2: 22 mmol/L (ref 22–32)
Calcium: 9.4 mg/dL (ref 8.9–10.3)
Creatinine, Ser: 0.57 mg/dL (ref 0.44–1.00)
GFR calc Af Amer: >60 mL/min (ref >60)
GFR calc non Af Amer: >60 mL/min (ref >60)
Glucose, Bld: 124 mg/dL — ABNORMAL HIGH (ref 70–99)
POTASSIUM: 4.3 mmol/L (ref 3.5–5.1)
Sodium: 134 mmol/L — ABNORMAL LOW (ref 135–145)

## 2017-10-20 LAB — CBC WITH DIFFERENTIAL/PLATELET
Basophils Absolute: 0 10*3/uL (ref 0.0–0.1)
Basophils Relative: 0 %
EOS PCT: 0 %
Eosinophils Absolute: 0 10*3/uL (ref 0.0–0.7)
HEMATOCRIT: 34.3 % — AB (ref 36.0–46.0)
HEMOGLOBIN: 12.3 g/dL (ref 12.0–15.0)
LYMPHS ABS: 4.1 10*3/uL — AB (ref 0.7–4.0)
LYMPHS PCT: 29 %
MCH: 46.2 pg — ABNORMAL HIGH (ref 26.0–34.0)
MCHC: 35.9 g/dL (ref 30.0–36.0)
MCV: 128.9 fL — AB (ref 78.0–100.0)
Monocytes Absolute: 1 10*3/uL (ref 0.1–1.0)
Monocytes Relative: 7 %
NEUTROS ABS: 8.9 10*3/uL — AB (ref 1.7–7.7)
Neutrophils Relative %: 64 %
Platelets: 547 10*3/uL — ABNORMAL HIGH (ref 150–400)
RBC: 2.66 MIL/uL — AB (ref 3.87–5.11)
RDW: 17.1 % — AB (ref 11.5–15.5)
WBC: 14 10*3/uL — AB (ref 4.0–10.5)

## 2017-10-20 LAB — ETHANOL: Alcohol, Ethyl (B): <10 mg/dL (ref <10)

## 2017-10-20 LAB — HEPATIC FUNCTION PANEL
ALBUMIN: 3.6 g/dL (ref 3.5–5.0)
ALK PHOS: 57 U/L (ref 38–126)
ALT: 31 U/L (ref 0–44)
AST: 65 U/L — AB (ref 15–41)
BILIRUBIN TOTAL: 2.2 mg/dL — AB (ref 0.3–1.2)
Bilirubin, Direct: 0.5 mg/dL — ABNORMAL HIGH (ref 0.0–0.2)
Indirect Bilirubin: 1.7 mg/dL — ABNORMAL HIGH (ref 0.3–0.9)
Total Protein: 8.5 g/dL — ABNORMAL HIGH (ref 6.5–8.1)

## 2017-10-20 LAB — MAGNESIUM: Magnesium: 1.7 mg/dL (ref 1.7–2.4)

## 2017-10-20 MED ORDER — FOLIC ACID 1 MG PO TABS
1.0000 mg | ORAL_TABLET | Freq: Once | ORAL | Status: AC
  Administered 2017-10-20: 1 mg via ORAL
  Filled 2017-10-20: qty 1

## 2017-10-20 MED ORDER — THIAMINE HCL 100 MG/ML IJ SOLN
100.0000 mg | Freq: Once | INTRAMUSCULAR | Status: AC
  Administered 2017-10-20: 100 mg via INTRAVENOUS
  Filled 2017-10-20: qty 2

## 2017-10-20 MED ORDER — FOLIC ACID 1 MG PO TABS: 1.0000 mg | ORAL_TABLET | Freq: Every day | ORAL | 1 refills | Status: DC

## 2017-10-20 MED ORDER — VITAMIN B-1 100 MG PO TABS: 100.0000 mg | ORAL_TABLET | Freq: Every day | ORAL | 0 refills | Status: DC

## 2017-10-20 NOTE — ED Triage Notes (Signed)
Pt reports continued weakness and bilateral hand numbness since Saturday. States she was evaluated over the weekend in ED and dx with hypokalemia. Sent home with prescription for potassium and magnesium. Denies v/d. Reports that she is a heavy drinker.

## 2017-10-20 NOTE — Discharge Instructions (Signed)
Take the vitamins as prescribed, also take a multivitamin daily, follow-up with a neurologist for further evaluation, call to schedule an appointment

## 2017-10-20 NOTE — ED Provider Notes (Signed)
[facility] EMERGENCY DEPARTMENT Provider Note   CSN: [id number] Arrival date & time: 10/20/17  1420     History   Chief Complaint Chief Complaint  Patient presents with  . Numbness    HPI Melinda Giles is a 47 y.o. female.  HPI She presents to the emergency room for evaluation of worsening numbness and weakness.  Patient states she started having trouble with feeling off balance as well as having numbness weakness primarily in her upper extremities.  She also had some numbness tongues and hand.  This started initially over the weekend.  She was seen in the emergency room and was diagnosed with hypokalemia and hypomagnesemia.  Patient does have history of chronic alcohol abuse and she drinks daily however she denies having anything to drink for the last couple of days.  Patient states her symptoms have not gotten any better and have gotten worse.  Is having increasing weakness in her upper extremities.  She denies any trouble with her speech.  No trouble with vision Past Medical History:  Diagnosis Date  . Hypertension   . Hypokalemia     There are no active problems to display for this patient.   History reviewed. No pertinent surgical history.   OB History   None      Home Medications    Prior to Admission medications   Medication Sig Start Date End Date Taking? Authorizing Provider  Magnesium 250 MG TABS Take 1 tablet (250 mg total) by mouth 2 (two) times daily. 10/15/17  Yes Mancel Bale, MD  metoprolol tartrate (LOPRESSOR) 50 MG tablet Take 1 tablet by mouth 2 (two) times daily. 08/04/17  Yes [provider]  Multiple Vitamin (MULTIVITAMIN) tablet Take 1 tablet by mouth daily. 09/28/10  Yes Carleene Cooper, MD  potassium chloride SA (K-DUR,KLOR-CON) 20 MEQ tablet Take 1 tablet (20 mEq total) by mouth 2 (two) times daily. 10/15/17 10/15/18 Yes Mancel Bale, MD  amLODipine (NORVASC) 10 MG tablet Take 10 mg by mouth daily. 08/04/17   [provider]    folic acid (FOLVITE) 1 MG tablet Take 1 tablet (1 mg total) by mouth daily. 10/20/17   Linwood Dibbles, MD  thiamine (VITAMIN B-1) 100 MG tablet Take 1 tablet (100 mg total) by mouth daily. 10/20/17   Linwood Dibbles, MD    Family History No family history on file.  Social History Social History   Tobacco Use  . Smoking status: Current Every Day Smoker    Packs/day: 0.50    Years: 30.00    Pack years: 15.00    Types: Cigarettes  . Smokeless tobacco: Never Used  Substance Use Topics  . Alcohol use: Yes    Comment: drinks  1/5 of liquor every two days.  . Drug use: No     Allergies   Other and Peanut-containing drug products   Review of Systems Review of Systems  All other systems reviewed and are negative.    Physical Exam Updated Vital Signs BP (!) 151/98 (BP Location: Right Arm)   Pulse 87   Temp 98.3 F (36.8 C) (Oral)   Resp 12   Ht 1.676 m (5\' 6" )   Wt 108.9 kg   LMP 08/30/2017 (Exact Date)   SpO2 100%   BMI 38.74 kg/m   Physical Exam  Constitutional: She is oriented to person, place, and time. She appears well-developed and well-nourished. No distress.  HENT:  Head: Normocephalic and atraumatic.  Right Ear: External ear normal.  Left Ear: External  ear normal.  Mouth/Throat: Oropharynx is clear and moist.  Eyes: Conjunctivae are normal. Right eye exhibits no discharge. Left eye exhibits no discharge. No scleral icterus.  Neck: Neck supple. No tracheal deviation present.  Cardiovascular: Normal rate, regular rhythm and intact distal pulses.  Pulmonary/Chest: Effort normal and breath sounds normal. No stridor. No respiratory distress. She has no wheezes. She has no rales.  Abdominal: Soft. Bowel sounds are normal. She exhibits no distension. There is no tenderness. There is no rebound and no guarding.  Musculoskeletal: She exhibits no edema or tenderness.  Neurological: She is alert and oriented to person, place, and time. She has normal strength. No cranial nerve  deficit (No facial droop, extraocular movements intact, tongue midline ) or sensory deficit. She exhibits normal muscle tone. She displays no seizure activity. Coordination normal.  No pronator drift bilateral upper extrem, able to hold both legs off bed for 5 seconds, sensation intact in all extremities, no visual field cuts, no left or right sided neglect, abnormal finger-nose exam bilaterally, difficulty with heal to shin no nystagmus noted   Skin: Skin is warm and dry. No rash noted.  Psychiatric: She has a normal mood and affect.  Nursing note and vitals reviewed.    ED Treatments / Results  Labs (all labs ordered are listed, but only abnormal results are displayed) Labs Reviewed  CBC WITH DIFFERENTIAL/PLATELET - Abnormal; Notable for the following components:      Result Value   WBC 14.0 (*)    RBC 2.66 (*)    HCT 34.3 (*)    MCV 128.9 (*)    MCH 46.2 (*)    RDW 17.1 (*)    Platelets 547 (*)    Neutro Abs 8.9 (*)    Lymphs Abs 4.1 (*)    All other components within normal limits  BASIC METABOLIC PANEL - Abnormal; Notable for the following components:   Sodium 134 (*)    Glucose, Bld 124 (*)    All other components within normal limits  HEPATIC FUNCTION PANEL - Abnormal; Notable for the following components:   Total Protein 8.5 (*)    AST 65 (*)    Total Bilirubin 2.2 (*)    Bilirubin, Direct 0.5 (*)    Indirect Bilirubin 1.7 (*)    All other components within normal limits  MAGNESIUM  ETHANOL    EKG None  Radiology Mr Brain Wo Contrast  Result Date: 10/20/2017 CLINICAL DATA:  Ataxia, progressive.  Hypokalemia. EXAM: MRI HEAD WITHOUT CONTRAST TECHNIQUE: Multiplanar, multiecho pulse sequences of the brain and surrounding structures were obtained without intravenous contrast. COMPARISON:  None. FINDINGS: Brain: No acute infarction, hemorrhage, hydrocephalus, extra-axial collection or mass lesion. Very mild periventricular FLAIR hyperintensity, usually microvascular  ischemic-patient has history of hypertension. Vascular: Normal flow voids. Skull and upper cervical spine: Small benign-appearing partially T1 hyperintense structure in the midline occipital bone. Generalized low marrow signal, possibly from patient's smoking history. There is also anemia based on current labs. Sinuses/Orbits: Negative IMPRESSION: No acute or reversible finding.  No specific explanation for ataxia. Electronically Signed   By: Marnee Spring M.D.   On: 10/20/2017 16:53   Mr Cervical Spine Wo Contrast  Result Date: 10/20/2017 CLINICAL DATA:  Bilateral hand numbness since Saturday. Hypokalemia. EXAM: MRI CERVICAL SPINE WITHOUT CONTRAST TECHNIQUE: Multiplanar, multisequence MR imaging of the cervical spine was performed. No intravenous contrast was administered. COMPARISON:  None. FINDINGS: Alignment: Normal Vertebrae: No fracture, evidence of discitis, or bone lesion. Cord: Normal signal  and morphology. Posterior Fossa, vertebral arteries, paraspinal tissues: Negative. Disc levels: Mild bulge or small protrusion from C3-4 to C6-7. No significant disc height loss or facet spurring. No evident impingement. Motion degradation that is particularly limiting on axial images. IMPRESSION: 1. No acute finding. No cord signal abnormality or impingement to explain symptoms. 2. Motion degraded, a significant limitation on axial series. Electronically Signed   By: Marnee Spring M.D.   On: 10/20/2017 16:47    Procedures Procedures (including critical care time)  Medications Ordered in ED Medications  thiamine (B-1) injection 100 mg (100 mg Intravenous Given 10/20/17 1652)  folic acid (FOLVITE) tablet 1 mg (1 mg Oral Given 10/20/17 1653)     Initial Impression / Assessment and Plan / ED Course  I have reviewed the triage vital signs and the nursing notes.  Pertinent labs & imaging results that were available during my care of the patient were reviewed by me and considered in my medical decision  making (see chart for details).  Clinical Course as of Oct 21 1803  Thu Oct 20, 2017  1804 Labs reviewed.  Patient has an elevated MCV.  Left he is are also slightly elevated.  These are consistent with her chronic alcohol use.  She is not hypokalemic or hypomagnesemic today.  MRI did not show any acute findings   [JK]    Clinical Course User Index [JK] Linwood Dibbles, MD   She presented with worsening numbness and coordination issues.  MRI of brain and cervical spine do not show any acute findings.  I wonder if she may be having some issues associated with thiamine deficiency from her alcoholism.  I will have the patient start taking thiamine and folic acid.  I will give her a referral to neurology for further evaluation.  I counseled the patient on cessation of alcohol use.  Final Clinical Impressions(s) / ED Diagnoses   Final diagnoses:  Ataxia    ED Discharge Orders         Ordered    thiamine (VITAMIN B-1) 100 MG tablet  Daily     10/20/17 1801    folic acid (FOLVITE) 1 MG tablet  Daily     10/20/17 1801           Linwood Dibbles, MD 10/20/17 1805

## 2018-09-12 ENCOUNTER — Other Ambulatory Visit: Payer: Self-pay

## 2018-09-12 DIAGNOSIS — Z20822 Contact with and (suspected) exposure to covid-19: Secondary | ICD-10-CM

## 2018-09-13 LAB — NOVEL CORONAVIRUS, NAA: SARS-CoV-2, NAA: NOT DETECTED

## 2018-12-05 ENCOUNTER — Other Ambulatory Visit (HOSPITAL_COMMUNITY): Payer: Self-pay | Admitting: *Deleted

## 2018-12-05 DIAGNOSIS — Z1231 Encounter for screening mammogram for malignant neoplasm of breast: Secondary | ICD-10-CM

## 2018-12-27 ENCOUNTER — Other Ambulatory Visit: Payer: Self-pay

## 2018-12-27 ENCOUNTER — Ambulatory Visit (HOSPITAL_COMMUNITY)
Admission: RE | Admit: 2018-12-27 | Discharge: 2018-12-27 | Disposition: A | Discharge status: Home / Self Care | Payer: PRIVATE HEALTH INSURANCE | Source: Ambulatory Visit | Attending: *Deleted | Admitting: *Deleted

## 2018-12-27 DIAGNOSIS — Z1231 Encounter for screening mammogram for malignant neoplasm of breast: Secondary | ICD-10-CM

## 2019-04-19 DIAGNOSIS — G629 Polyneuropathy, unspecified: Secondary | ICD-10-CM | POA: Insufficient documentation

## 2019-04-19 DIAGNOSIS — M79643 Pain in unspecified hand: Secondary | ICD-10-CM | POA: Insufficient documentation

## 2019-04-19 DIAGNOSIS — M79673 Pain in unspecified foot: Secondary | ICD-10-CM | POA: Insufficient documentation

## 2019-05-30 DIAGNOSIS — G621 Alcoholic polyneuropathy: Secondary | ICD-10-CM | POA: Insufficient documentation

## 2019-05-30 DIAGNOSIS — R269 Unspecified abnormalities of gait and mobility: Secondary | ICD-10-CM | POA: Insufficient documentation

## 2019-07-18 DIAGNOSIS — D649 Anemia, unspecified: Secondary | ICD-10-CM | POA: Insufficient documentation

## 2019-08-29 DIAGNOSIS — E559 Vitamin D deficiency, unspecified: Secondary | ICD-10-CM | POA: Insufficient documentation

## 2020-04-15 ENCOUNTER — Other Ambulatory Visit (HOSPITAL_COMMUNITY): Payer: Self-pay | Admitting: Neurology

## 2020-04-15 DIAGNOSIS — M25511 Pain in right shoulder: Secondary | ICD-10-CM

## 2020-04-16 ENCOUNTER — Other Ambulatory Visit: Payer: Self-pay

## 2020-04-16 ENCOUNTER — Ambulatory Visit (HOSPITAL_COMMUNITY)
Admission: RE | Admit: 2020-04-16 | Discharge: 2020-04-16 | Disposition: A | Discharge status: Home / Self Care | Payer: Medicaid Other | Source: Ambulatory Visit | Attending: Neurology | Admitting: Neurology

## 2020-04-16 DIAGNOSIS — M25511 Pain in right shoulder: Secondary | ICD-10-CM

## 2020-04-17 ENCOUNTER — Encounter: Payer: Self-pay | Admitting: *Deleted

## 2020-04-23 ENCOUNTER — Encounter: Payer: Self-pay | Admitting: Internal Medicine

## 2020-06-10 ENCOUNTER — Other Ambulatory Visit: Payer: Self-pay

## 2020-06-10 ENCOUNTER — Encounter: Payer: Self-pay | Admitting: Nutrition

## 2020-06-10 ENCOUNTER — Encounter: Payer: Medicare Other | Attending: *Deleted | Admitting: Nutrition

## 2020-06-10 DIAGNOSIS — R749 Abnormal serum enzyme level, unspecified: Secondary | ICD-10-CM | POA: Diagnosis present

## 2020-06-10 DIAGNOSIS — E1165 Type 2 diabetes mellitus with hyperglycemia: Secondary | ICD-10-CM | POA: Insufficient documentation

## 2020-06-10 DIAGNOSIS — E118 Type 2 diabetes mellitus with unspecified complications: Secondary | ICD-10-CM | POA: Diagnosis present

## 2020-06-10 DIAGNOSIS — IMO0002 Reserved for concepts with insufficient information to code with codable children: Secondary | ICD-10-CM

## 2020-06-10 DIAGNOSIS — E669 Obesity, unspecified: Secondary | ICD-10-CM | POA: Insufficient documentation

## 2020-06-10 DIAGNOSIS — G621 Alcoholic polyneuropathy: Secondary | ICD-10-CM | POA: Insufficient documentation

## 2020-06-10 NOTE — Patient Instructions (Signed)
Goals Established by Pt . Follow My Plate . Set alarm and get up in am for breakfast . Talk to MD about help with addictions of alcohol . Increase fresh fruits and vegetables. . Take Metformin as prescribed after breakfast daily. . Drink only water . Try AA meetings. . Don't skip meals.

## 2020-06-10 NOTE — Progress Notes (Signed)
Medical Nutrition Therapy  Appointment Start time:  1330  Appointment End time: 1430  Primary concerns today: DIabetes Type 2, Obesity Referral diagnosis E11.8, E66.01  Preferred learning style:  no preference indicated Learning readiness: contemplating  NUTRITION ASSESSMENT  Current diet is insuffient to meet her needs as she is fasting for religious reasons. She notes her diet isn't very well balanced either. She drinks 3 margaritias a night and smokes 1/2 pack of cigarettes per day. Willing to work on quit drinking as a priority first and then will work on smoking cessation.  She admits to depression and is on medication but would like some counseling help also to help with her addictions.  Anthropometrics  Wt Readings from Last 3 Encounters:  10/20/17 240 lb (108.9 kg)  10/15/17 250 lb (113.4 kg)  08/06/11 170 lb (77.1 kg)   Ht Readings from Last 3 Encounters:  10/20/17 5\' 6"  (1.676 m)  10/15/17 5\' 6"  (1.676 m)  08/06/11 5\' 5"  (1.651 m)   There is no height or weight on file to calculate BMI. @BMIFA @ Facility age limit for growth percentiles is 20 years. Facility age limit for growth percentiles is 20 years.   Clinical Medical Hx: DM Type 2, alcohol abuse, pain medications use, smoker, elevated liver enzymes. Medications:Metformin, thiamine Labs:a1c 7.1%  Notable Signs/Symptoms: fatigue, craves salt and sugar, and alcohol.  Lifestyle & Dietary Hx Current  liIves upstairs in her parents apartment. 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 Eats often with her parents.   Estimated daily fluid intake: 24 oz Supplements:  Sleep: Doesn't sleep well Stress / self-care: health issues Current average weekly physical activity: ADL  24-Hr Dietary Recall First Meal: Fasting  NOthing Drinks 3 margaritas a day.  Estimated Energy Needs Calories: 1200 Carbohydrate: 135g Protein: 90g Fat: 33g   NUTRITION  DIAGNOSIS  NB-1.1 Food and nutrition-related knowledge deficit As related to Diabetes Type 2.  As evidenced by A1C 7.3%.   NUTRITION INTERVENTION  Nutrition education (E-1) on the following topics:  . Nutrition and Diabetes education provided on My Plate, CHO counting, meal planning, portion sizes, timing of meals, avoiding snacks between meals unless having a low blood sugar, target ranges for A1C and blood sugars, signs/symptoms and treatment of hyper/hypoglycemia, monitoring blood sugars, taking medications as prescribed, benefits of exercising 30 minutes per day and prevention of complications of DM.   Handouts Provided Include   My Plate  Meal Plan Card  Diabetes instructions  Learning Style & Readiness for Change Teaching method utilized: Visual & Auditory  Demonstrated degree of understanding via: Teach Back  Barriers to learning/adherence to lifestyle change: depression and addictions  Goals Established by Pt . Follow My Plate . Set alarm and get up in am for breakfast . Talk to MD about help with addictions of alcohol . Increase fresh fruits and vegetables. . Take Metformin as prescribed after breakfast daily. . Drink only water . Try AA meetings. . Don't skip meals.   MONITORING & EVALUATION Dietary intake, weekly physical activity, and blood sugars  in 1 month  Next Steps  Patient is to work on quitting drinking.08/08/11

## 2020-06-17 ENCOUNTER — Telehealth: Payer: Self-pay | Admitting: *Deleted

## 2020-06-17 NOTE — Telephone Encounter (Signed)
Melinda Giles, you are scheduled for a virtual visit with your provider today.  Just as we do with appointments in the office, we must obtain your consent to participate.  Your consent will be active for this visit and any virtual visit you may have with one of our providers in the next 365 days.  If you have a MyChart account, I can also send a copy of this consent to you electronically.  All virtual visits are billed to your insurance company just like a traditional visit in the office.  As this is a virtual visit, video technology does not allow for your provider to perform a traditional examination.  This may limit your provider's ability to fully assess your condition.  If your provider identifies any concerns that need to be evaluated in person or the need to arrange testing such as labs, EKG, etc, we will make arrangements to do so.  Although advances in technology are sophisticated, we cannot ensure that it will always work on either your end or our end.  If the connection with a video visit is poor, we may have to switch to a telephone visit.  With either a video or telephone visit, we are not always able to ensure that we have a secure connection.   I need to obtain your verbal consent now.   Are you willing to proceed with your visit today?

## 2020-06-17 NOTE — Telephone Encounter (Signed)
Pt consented to a virtual visit for 06/18/2020.

## 2020-06-18 ENCOUNTER — Ambulatory Visit: Payer: Medicaid Other | Admitting: Gastroenterology

## 2020-06-18 ENCOUNTER — Telehealth (INDEPENDENT_AMBULATORY_CARE_PROVIDER_SITE_OTHER): Payer: Medicare Other | Admitting: Gastroenterology

## 2020-06-18 ENCOUNTER — Encounter: Payer: Self-pay | Admitting: Gastroenterology

## 2020-06-18 ENCOUNTER — Telehealth: Payer: Self-pay | Admitting: Gastroenterology

## 2020-06-18 DIAGNOSIS — R7989 Other specified abnormal findings of blood chemistry: Secondary | ICD-10-CM

## 2020-06-18 NOTE — Progress Notes (Signed)
Attempted visit but was unsuccessful due to technical difficulties.

## 2020-06-18 NOTE — Telephone Encounter (Signed)
Melinda Giles,  I was unsuccessful in contacting patient for new visit today due to technical difficulties. Can we please have her come in for new patient with first available?

## 2020-06-26 ENCOUNTER — Encounter: Payer: Self-pay | Admitting: Nutrition

## 2020-06-30 NOTE — Telephone Encounter (Signed)
PT SCHEDULED TO SEE DR CARVER ON 6/22

## 2020-07-08 ENCOUNTER — Ambulatory Visit: Payer: Medicare Other | Admitting: Nutrition

## 2020-07-09 ENCOUNTER — Other Ambulatory Visit: Payer: Self-pay

## 2020-07-09 ENCOUNTER — Encounter: Payer: Self-pay | Admitting: Internal Medicine

## 2020-07-09 ENCOUNTER — Ambulatory Visit (INDEPENDENT_AMBULATORY_CARE_PROVIDER_SITE_OTHER): Payer: Medicare Other | Admitting: Internal Medicine

## 2020-07-09 VITALS — BP 156/96 | HR 83 | Temp 96.6°F | Ht 65.0 in | Wt 267.2 lb

## 2020-07-09 DIAGNOSIS — R748 Abnormal levels of other serum enzymes: Secondary | ICD-10-CM

## 2020-07-09 DIAGNOSIS — Z1211 Encounter for screening for malignant neoplasm of colon: Secondary | ICD-10-CM | POA: Diagnosis not present

## 2020-07-09 MED ORDER — PEG 3350-KCL-NA BICARB-NACL 420 G PO SOLR: 4000.0000 mL | ORAL | 0 refills | Status: DC

## 2020-07-09 NOTE — Progress Notes (Deleted)
Primary Care Physician:  Tylene Fantasia., PA-C Primary Gastroenterologist:  Dr. Marletta Lor  Chief Complaint  Patient presents with   Colonoscopy    Abnormal LFT    HPI:   Melinda Giles is a 50 y.o. female who presents   Past Medical History:  Diagnosis Date   Hypertension    Hypokalemia     No past surgical history on file.  Current Outpatient Medications  Medication Sig Dispense Refill   Buprenorphine HCl-Naloxone HCl 2-0.5 MG FILM buprenorphine 2 mg-naloxone 0.5 mg sublingual film  PLACE 1/2 (ONE-HALF) STRIP UNDER THE TONGUE IN THE MORNING AND 1 STRIP AT NIGHT DAILY     DULoxetine (CYMBALTA) 30 MG capsule 1 capsule in the morning and at bedtime.     metoprolol tartrate (LOPRESSOR) 50 MG tablet Take 1 tablet by mouth 2 (two) times daily.  1   Multiple Vitamin (MULTIVITAMIN) tablet Take 1 tablet by mouth daily. 30 tablet 2   norethindrone (MICRONOR) 0.35 MG tablet Take 1 tablet by mouth daily.     Omega-3 Fatty Acids (FISH OIL PO) Take by mouth daily.     pregabalin (LYRICA) 150 MG capsule 1 capsule in the morning and at bedtime.     metFORMIN (GLUCOPHAGE-XR) 500 MG 24 hr tablet daily. In the morning (Patient not taking: Reported on 07/09/2020)     No current facility-administered medications for this visit.    Allergies as of 07/09/2020 - Review Complete 06/26/2020  Allergen Reaction Noted   Other Anaphylaxis and Swelling 08/06/2011    No family history on file.  Social History   Socioeconomic History   Marital status: Single    Spouse name: Not on file   Number of children: Not on file   Years of education: Not on file   Highest education level: Not on file  Occupational History   Not on file  Tobacco Use   Smoking status: Every Day    Packs/day: 0.50    Years: 30.00    Pack years: 15.00    Types: Cigarettes   Smokeless tobacco: Never   Tobacco comments:    depression  Vaping Use   Vaping Use: Never used  Substance and Sexual Activity   Alcohol  use: Yes    Comment: coctails/margaritas 3 drinks 3 times per week   Drug use: No   Sexual activity: Yes    Birth control/protection: Pill  Other Topics Concern   Not on file  Social History Narrative   Not on file   Social Determinants of Health   Financial Resource Strain: Not on file  Food Insecurity: Not on file  Transportation Needs: Not on file  Physical Activity: Not on file  Stress: Not on file  Social Connections: Not on file  Intimate Partner Violence: Not on file    Subjective: Review of Systems  Constitutional:  Negative for chills and fever.  HENT:  Negative for congestion and hearing loss.   Eyes:  Negative for blurred vision and double vision.  Respiratory:  Negative for cough and shortness of breath.   Cardiovascular:  Negative for chest pain and palpitations.  Gastrointestinal:  Negative for abdominal pain, blood in stool, constipation, diarrhea, heartburn, melena and vomiting.  Genitourinary:  Negative for dysuria and urgency.  Musculoskeletal:  Negative for joint pain and myalgias.  Skin:  Negative for itching and rash.  Neurological:  Negative for dizziness and headaches.  Psychiatric/Behavioral:  Negative for depression. The patient is not nervous/anxious.  Objective: BP (!) 156/96   Pulse 83   Temp (!) 96.6 F (35.9 C) (Temporal)   Ht 5\' 5"  (1.651 m)   Wt 267 lb 3.2 oz (121.2 kg)   BMI 44.46 kg/m  Physical Exam Constitutional:      Appearance: Normal appearance.  HENT:     Head: Normocephalic and atraumatic.  Eyes:     Extraocular Movements: Extraocular movements intact.     Conjunctiva/sclera: Conjunctivae normal.  Cardiovascular:     Rate and Rhythm: Normal rate and regular rhythm.  Pulmonary:     Effort: Pulmonary effort is normal.     Breath sounds: Normal breath sounds.  Abdominal:     General: Bowel sounds are normal.     Palpations: Abdomen is soft.  Musculoskeletal:        General: No swelling. Normal range of motion.      Cervical back: Normal range of motion and neck supple.  Skin:    General: Skin is warm and dry.     Coloration: Skin is not jaundiced.  Neurological:     General: No focal deficit present.     Mental Status: She is alert and oriented to person, place, and time.  Psychiatric:        Mood and Affect: Mood normal.        Behavior: Behavior normal.     Assessment: *  Plan:   07/09/2020 3:18 PM   Disclaimer: This note was dictated with voice recognition software. Similar sounding words can inadvertently be transcribed and may not be corrected upon review.

## 2020-07-09 NOTE — Patient Instructions (Signed)
We will schedule you for colonoscopy for colon cancer screening purposes today.  For your abnormal liver function test, I am going to order more blood work at [facility] to further evaluate for underlying causes.  Recommend discontinuing all alcohol use.  I will also order ultrasound with elastography to evaluate for chronic liver disease.  Further recommendations to follow.  At [facility] we value your feedback. You may receive a survey about your visit today. Please share your experience as we strive to create trusting relationships with our patients to provide genuine, compassionate, quality care.  We appreciate your understanding and patience as we review any laboratory studies, imaging, and other diagnostic tests that are ordered as we care for you. Our office policy is 5 business days for review of these results, and any emergent or urgent results are addressed in a timely manner for your best interest. If you do not hear from our office in 1 week, please contact us.   We also encourage the use of MyChart, which contains your medical information for your review as well. If you are not enrolled in this feature, an access code is on this after visit summary for your convenience. Thank you for allowing [place] to be involved in your care.  It was great to see you today!  I hope you have a great rest of your summer!!    Hennie Duos. Marletta Lor, D.O. Gastroenterology and Hepatology [facility]

## 2020-07-09 NOTE — Progress Notes (Signed)
Primary Care Physician:  Raiford Simmonds., PA-C Primary Gastroenterologist:  Dr. Abbey Chatters  Chief Complaint  Patient presents with   Colonoscopy    Abnormal LFT    HPI:   Melinda Giles is a 50 y.o. female who presents to clinic today by referral from her PCP Royce Macadamia for evaluation.  Patient would like to be scheduled for screening colonoscopy.  No previous colonoscopy in the past.  No family history of colorectal malignancy.  No melena hematochezia.  No unintentional weight loss.  She was also referred for abnormal LFTs.  Most recent blood work 04/18/2020 showed AST 82, ALT 42, alk phos 109, T bili 1.4.  Patient notes drinking 1 to 2 can margaritas a day.  She states up until 2019 she was drinking much heavier for greater than 10 years.  States she would drink half gallons every couple days of liquor.  Also notes family history of alcohol abuse in her family.  Unsure if any cirrhosis in her family.  No IV drug abuse.  No exposure to viral hepatitis that she knows of.  Past Medical History:  Diagnosis Date   Hypertension    Hypokalemia     No past surgical history on file.  Current Outpatient Medications  Medication Sig Dispense Refill   Buprenorphine HCl-Naloxone HCl 2-0.5 MG FILM buprenorphine 2 mg-naloxone 0.5 mg sublingual film  PLACE 1/2 (ONE-HALF) STRIP UNDER THE TONGUE IN THE MORNING AND 1 STRIP AT NIGHT DAILY     DULoxetine (CYMBALTA) 30 MG capsule 1 capsule in the morning and at bedtime.     metoprolol tartrate (LOPRESSOR) 50 MG tablet Take 1 tablet by mouth 2 (two) times daily.  1   Multiple Vitamin (MULTIVITAMIN) tablet Take 1 tablet by mouth daily. 30 tablet 2   norethindrone (MICRONOR) 0.35 MG tablet Take 1 tablet by mouth daily.     Omega-3 Fatty Acids (FISH OIL PO) Take by mouth daily.     pregabalin (LYRICA) 150 MG capsule 1 capsule in the morning and at bedtime.     metFORMIN (GLUCOPHAGE-XR) 500 MG 24 hr tablet daily. In the morning (Patient not taking:  Reported on 07/09/2020)     No current facility-administered medications for this visit.    Allergies as of 07/09/2020 - Review Complete 06/26/2020  Allergen Reaction Noted   Other Anaphylaxis and Swelling 08/06/2011    No family history on file.  Social History   Socioeconomic History   Marital status: Single    Spouse name: Not on file   Number of children: Not on file   Years of education: Not on file   Highest education level: Not on file  Occupational History   Not on file  Tobacco Use   Smoking status: Every Day    Packs/day: 0.50    Years: 30.00    Pack years: 15.00    Types: Cigarettes   Smokeless tobacco: Never   Tobacco comments:    depression  Vaping Use   Vaping Use: Never used  Substance and Sexual Activity   Alcohol use: Yes    Comment: coctails/margaritas 3 drinks 3 times per week   Drug use: No   Sexual activity: Yes    Birth control/protection: Pill  Other Topics Concern   Not on file  Social History Narrative   Not on file   Social Determinants of Health   Financial Resource Strain: Not on file  Food Insecurity: Not on file  Transportation Needs: Not on file  Physical Activity: Not on file  Stress: Not on file  Social Connections: Not on file  Intimate Partner Violence: Not on file    Subjective: Review of Systems  Constitutional:  Negative for chills and fever.  HENT:  Negative for congestion and hearing loss.   Eyes:  Negative for blurred vision and double vision.  Respiratory:  Negative for cough and shortness of breath.   Cardiovascular:  Negative for chest pain and palpitations.  Gastrointestinal:  Negative for abdominal pain, blood in stool, constipation, diarrhea, heartburn, melena and vomiting.  Genitourinary:  Negative for dysuria and urgency.  Musculoskeletal:  Negative for joint pain and myalgias.  Skin:  Negative for itching and rash.  Neurological:  Negative for dizziness and headaches.  Psychiatric/Behavioral:  Negative  for depression. The patient is not nervous/anxious.       Objective: BP (!) 156/96   Pulse 83   Temp (!) 96.6 F (35.9 C) (Temporal)   Ht 5' 5"  (1.651 m)   Wt 267 lb 3.2 oz (121.2 kg)   BMI 44.46 kg/m  Physical Exam Constitutional:      Appearance: Normal appearance. She is obese.  HENT:     Head: Normocephalic and atraumatic.  Eyes:     Extraocular Movements: Extraocular movements intact.     Conjunctiva/sclera: Conjunctivae normal.  Cardiovascular:     Rate and Rhythm: Normal rate and regular rhythm.  Pulmonary:     Effort: Pulmonary effort is normal.     Breath sounds: Normal breath sounds.  Abdominal:     General: Bowel sounds are normal.     Palpations: Abdomen is soft.  Musculoskeletal:        General: No swelling. Normal range of motion.     Cervical back: Normal range of motion and neck supple.  Skin:    General: Skin is warm and dry.     Coloration: Skin is not jaundiced.  Neurological:     General: No focal deficit present.     Mental Status: She is alert and oriented to person, place, and time.  Psychiatric:        Mood and Affect: Mood normal.        Behavior: Behavior normal.     Assessment: *Abnormal liver function test *Colon cancer screening  Plan: Etiology of patient's abnormal LFTs unclear.  Given pattern of her aminotransferases, query alcohol induced hepatitis.  Also has risk factors for fatty liver disease including obesity and diabetes.  We will perform serological work-up to rule out other causes including autoimmune, viral hepatitis, etc.  I will also order right upper quadrant ultrasound with elastography to evaluate for chronic liver disease.  Will schedule for screening colonoscopy.The risks including infection, bleed, or perforation as well as benefits, limitations, alternatives and imponderables have been reviewed with the patient. Questions have been answered. All parties agreeable.   Thank you Royce Macadamia for the kind  referral.  07/09/2020 3:34 PM   Disclaimer: This note was dictated with voice recognition software. Similar sounding words can inadvertently be transcribed and may not be corrected upon review.

## 2020-07-10 ENCOUNTER — Telehealth: Payer: Self-pay

## 2020-07-10 NOTE — Telephone Encounter (Signed)
Tried to call pt to inform her of pre-op appt 07/17/20 at 1:45pm, LMOVM to inform her of appt. Letter mailed with procedure instructions.

## 2020-07-16 ENCOUNTER — Other Ambulatory Visit: Payer: Self-pay

## 2020-07-16 ENCOUNTER — Ambulatory Visit (HOSPITAL_COMMUNITY)
Admission: RE | Admit: 2020-07-16 | Discharge: 2020-07-16 | Disposition: A | Discharge status: Home / Self Care | Payer: Medicare Other | Source: Ambulatory Visit | Attending: Internal Medicine | Admitting: Internal Medicine

## 2020-07-16 DIAGNOSIS — R748 Abnormal levels of other serum enzymes: Secondary | ICD-10-CM | POA: Diagnosis not present

## 2020-07-16 LAB — HEPATIC FUNCTION PANEL
AG Ratio: 0.6 (calc) — ABNORMAL LOW (ref 1.0–2.5)
ALT: 15 U/L (ref 6–29)
AST: 38 U/L — ABNORMAL HIGH (ref 10–35)
Albumin: 2.8 g/dL — ABNORMAL LOW (ref 3.6–5.1)
Alkaline phosphatase (APISO): 65 U/L (ref 37–153)
Bilirubin, Direct: 0.6 mg/dL — ABNORMAL HIGH (ref 0.0–0.2)
Globulin: 4.7 g/dL (calc) — ABNORMAL HIGH (ref 1.9–3.7)
Indirect Bilirubin: 1.5 mg/dL (calc) — ABNORMAL HIGH (ref 0.2–1.2)
Total Bilirubin: 2.1 mg/dL — ABNORMAL HIGH (ref 0.2–1.2)
Total Protein: 7.5 g/dL (ref 6.1–8.1)

## 2020-07-16 LAB — HEPATITIS B SURFACE ANTIBODY,QUALITATIVE: Hep B S Ab: REACTIVE — AB

## 2020-07-16 LAB — IRON,TIBC AND FERRITIN PANEL
%SAT: 65 % (calc) — ABNORMAL HIGH (ref 16–45)
Ferritin: 249 ng/mL — ABNORMAL HIGH (ref 16–232)
Iron: 143 ug/dL (ref 45–160)
TIBC: 219 mcg/dL (calc) — ABNORMAL LOW (ref 250–450)

## 2020-07-16 LAB — HEPATITIS B SURFACE ANTIGEN: Hepatitis B Surface Ag: NONREACTIVE

## 2020-07-16 LAB — ANTI-SMOOTH MUSCLE ANTIBODY, IGG: Actin (Smooth Muscle) Antibody (IGG): <20 U (ref <20)

## 2020-07-16 LAB — HEPATITIS C ANTIBODY
Hepatitis C Ab: NONREACTIVE
SIGNAL TO CUT-OFF: 0.07 (ref <1.00)

## 2020-07-16 LAB — IGG, IGA, IGM
IgG (Immunoglobin G), Serum: 3064 mg/dL — ABNORMAL HIGH (ref 600–1640)
IgM, Serum: 164 mg/dL (ref 50–300)
Immunoglobulin A: 934 mg/dL — ABNORMAL HIGH (ref 47–310)

## 2020-07-16 LAB — MITOCHONDRIAL ANTIBODIES: Mitochondrial M2 Ab, IgG: <=20 U

## 2020-07-16 LAB — HEPATITIS A ANTIBODY, TOTAL: Hepatitis A AB,Total: REACTIVE — AB

## 2020-07-16 LAB — ANA: Anti Nuclear Antibody (ANA): NEGATIVE

## 2020-07-16 LAB — HEPATITIS B CORE ANTIBODY, TOTAL: Hep B Core Total Ab: NONREACTIVE

## 2020-07-17 ENCOUNTER — Telehealth: Payer: Self-pay | Admitting: Internal Medicine

## 2020-07-17 ENCOUNTER — Other Ambulatory Visit: Payer: Self-pay

## 2020-07-17 ENCOUNTER — Encounter (HOSPITAL_COMMUNITY)
Admission: RE | Admit: 2020-07-17 | Discharge: 2020-07-17 | Disposition: A | Discharge status: Home / Self Care | Payer: Medicare Other | Source: Ambulatory Visit | Attending: Internal Medicine | Admitting: Internal Medicine

## 2020-07-17 NOTE — Patient Instructions (Signed)
Melinda Giles  07/17/2020     @PREFPERIOPPHARMACY @   Your procedure is scheduled on 07/22/20.  Report to 09/22/20 at 1115 A.M.  Call this number if you have problems the morning of surgery:  [phone]   Remember:  Do not eat or drink after midnight. Follow you prep & diet instructions that were provided from the office.               Take these medicines the morning of surgery with A SIP OF WATER cymbalta, clairitin, metoprolol, buprenorphine, pregabalin. Do not take any diabetic medicine the morning of your procedure.    Do not wear jewelry, make-up or nail polish.  Do not wear lotions, powders, or perfumes, or deodorant.  Do not shave 48 hours prior to surgery.  Men may shave face and neck.  Do not bring valuables to the hospital.  [facility] is not responsible for any belongings or valuables.  Contacts, dentures or bridgework may not be worn into surgery.  Leave your suitcase in the car.  After surgery it may be brought to your room.  For patients admitted to the hospital, discharge time will be determined by your treatment team.  Patients discharged the day of surgery will not be allowed to drive home.    Special instructions:  follow diet instructions from office  Please read over the following fact sheets that you were given. Anesthesia Post-op Instructions and Care and Recovery After Surgery      Monitored Anesthesia Care, Care After This sheet gives you information about how to care for yourself after your procedure. Your health care provider may also give you more specific instructions. If you have problems or questions, contact your health careprovider. What can I expect after the procedure? After the procedure, it is common to have: Tiredness. Forgetfulness about what happened after the procedure. Impaired judgment for important decisions. Nausea or vomiting. Some difficulty with balance. Follow these instructions at home: For the time period you were  told by your health care provider:     Rest as needed. Do not participate in activities where you could fall or become injured. Do not drive or use machinery. Do not drink alcohol. Do not take sleeping pills or medicines that cause drowsiness. Do not make important decisions or sign legal documents. Do not take care of children on your own. Eating and drinking Follow the diet that is recommended by your health care provider. Drink enough fluid to keep your urine pale yellow. If you vomit: Drink water, juice, or soup when you can drink without vomiting. Make sure you have little or no nausea before eating solid foods. General instructions Have a responsible adult stay with you for the time you are told. It is important to have someone help care for you until you are awake and alert. Take over-the-counter and prescription medicines only as told by your health care provider. If you have sleep apnea, surgery and certain medicines can increase your risk for breathing problems. Follow instructions from your health care provider about wearing your sleep device: Anytime you are sleeping, including during daytime naps. While taking prescription pain medicines, sleeping medicines, or medicines that make you drowsy. Avoid smoking. Keep all follow-up visits as told by your health care provider. This is important. Contact a health care provider if: You keep feeling nauseous or you keep vomiting. You feel light-headed. You are still sleepy or having trouble with balance after 24 hours. You develop a rash. You  have a fever. You have redness or swelling around the IV site. Get help right away if: You have trouble breathing. You have new-onset confusion at home. Summary For several hours after your procedure, you may feel tired. You may also be forgetful and have poor judgment. Have a responsible adult stay with you for the time you are told. It is important to have someone help care for you  until you are awake and alert. Rest as told. Do not drive or operate machinery. Do not drink alcohol or take sleeping pills. Get help right away if you have trouble breathing, or if you suddenly become confused. This information is not intended to replace advice given to you by your health care provider. Make sure you discuss any questions you have with your healthcare provider. Document Revised: 09/20/2019 Document Reviewed: 12/07/2018 Elsevier Patient Education  2022 [employer]. Colonoscopy, Adult A colonoscopy is a procedure to look at the entire large intestine. This procedure is done using a long, thin, flexible tube that has a camera on theend. You may have a colonoscopy: As a part of normal colorectal screening. If you have certain symptoms, such as: A low number of red blood cells in your blood (anemia). Diarrhea that does not go away. Pain in your abdomen. Blood in your stool. A colonoscopy can help screen for and diagnose medical problems, including: Tumors. Extra tissue that grows where mucus forms (polyps). Inflammation. Areas of bleeding. Tell your health care provider about: Any allergies you have. All medicines you are taking, including vitamins, herbs, eye drops, creams, and over-the-counter medicines. Any problems you or family members have had with anesthetic medicines. Any blood disorders you have. Any surgeries you have had. Any medical conditions you have. Any problems you have had with having bowel movements. Whether you are pregnant or may be pregnant. What are the risks? Generally, this is a safe procedure. However, problems may occur, including: Bleeding. Damage to your intestine. Allergic reactions to medicines given during the procedure. Infection. This is rare. What happens before the procedure? Eating and drinking restrictions Follow instructions from your health care provider about eating or drinking restrictions, which may include: A few days  before the procedure: Follow a low-fiber diet. Avoid nuts, seeds, dried fruit, raw fruits, and vegetables. 1-3 days before the procedure: Eat only gelatin dessert or ice pops. Drink only clear liquids, such as water, clear juice, clear broth or bouillon, black coffee or tea, or clear soft drinks or sports drinks. Avoid liquids that contain red or purple dye. The day of the procedure: Do not eat solid foods. You may continue to drink clear liquids until up to 2 hours before the procedure. Do not eat or drink anything starting 2 hours before the procedure, or within the time period that your health care provider recommends. Bowel prep If you were prescribed a bowel prep to take by mouth (orally) to clean out your colon: Take it as told by your health care provider. Starting the day before your procedure, you will need to drink a large amount of liquid medicine. The liquid will cause you to have many bowel movements of loose stool until your stool becomes almost clear or light green. If your skin or the opening between the buttocks (anus) gets irritated from diarrhea, you may relieve the irritation using: Wipes with medicine in them, such as adult wet wipes with aloe and vitamin E. A product to soothe skin, such as petroleum jelly. If you vomit while drinking the  bowel prep: Take a break for up to 60 minutes. Begin the bowel prep again. Call your health care provider if you keep vomiting or you cannot take the bowel prep without vomiting. To clean out your colon, you may also be given: Laxative medicines. These help you have a bowel movement. Instructions for enema use. An enema is liquid medicine injected into your rectum. Medicines Ask your health care provider about: Changing or stopping your regular medicines or supplements. This is especially important if you are taking iron supplements, diabetes medicines, or blood thinners. Taking medicines such as aspirin and ibuprofen. These medicines  can thin your blood. Do not take these medicines unless your health care provider tells you to take them. Taking over-the-counter medicines, vitamins, herbs, and supplements. General instructions Ask your health care provider what steps will be taken to help prevent infection. These may include washing skin with a germ-killing soap. Plan to have someone take you home from the hospital or clinic. What happens during the procedure?  An IV will be inserted into one of your veins. You may be given one or more of the following: A medicine to help you relax (sedative). A medicine to numb the area (local anesthetic). A medicine to make you fall asleep (general anesthetic). This is rarely needed. You will lie on your side with your knees bent. The tube will: Have oil or gel put on it (be lubricated). Be inserted into your anus. Be gently eased through all parts of your large intestine. Air will be sent into your colon to keep it open. This may cause some pressure or cramping. Images will be taken with the camera and will appear on a screen. A small tissue sample may be removed to be looked at under a microscope (biopsy). The tissue may be sent to a lab for testing if any signs of problems are found. If small polyps are found, they may be removed and checked for cancer cells. When the procedure is finished, the tube will be removed. The procedure may vary among health care providers and hospitals. What happens after the procedure? Your blood pressure, heart rate, breathing rate, and blood oxygen level will be monitored until you leave the hospital or clinic. You may have a small amount of blood in your stool. You may Herzberg gas and have mild cramping or bloating in your abdomen. This is caused by the air that was used to open your colon during the exam. Do not drive for 24 hours after the procedure. It is up to you to get the results of your procedure. Ask your health care provider, or the  department that is doing the procedure, when your results will be ready. Summary A colonoscopy is a procedure to look at the entire large intestine. Follow instructions from your health care provider about eating and drinking before the procedure. If you were prescribed an oral bowel prep to clean out your colon, take it as told by your health care provider. During the colonoscopy, a flexible tube with a camera on its end is inserted into the anus and then passed into the other parts of the large intestine. This information is not intended to replace advice given to you by your health care provider. Make sure you discuss any questions you have with your healthcare provider. Document Revised: 07/28/2018 Document Reviewed: 07/28/2018 Elsevier Patient Education  2022 [employer].

## 2020-07-17 NOTE — Telephone Encounter (Signed)
Spoke to pt, TCS rescheduled to 08/18/20 at 11:30am. Endo scheduler informed.

## 2020-07-17 NOTE — Telephone Encounter (Signed)
Pre-op appt 08/14/20. Appt letter mailed with new procedure instructions.

## 2020-07-17 NOTE — Telephone Encounter (Signed)
PATIENT [id number] PATIENT CALLED TO RESCHEDULE HER PROCEDURE, DID NOT THINK THAT IS WAS OVER A HOLIDAY WEEKEND AND SHE WILL NOT BE ABLE TO EAT ANYTHING.

## 2020-07-22 ENCOUNTER — Telehealth: Payer: Self-pay | Admitting: Internal Medicine

## 2020-07-22 DIAGNOSIS — R790 Abnormal level of blood mineral: Secondary | ICD-10-CM

## 2020-07-22 NOTE — Telephone Encounter (Signed)
Please let patient know that her liver enzymes are slightly improved though her iron studies were abnormal.  Given family history of liver disease as well as her abnormal iron studies, I am going to need to check her for hemochromatosis which is an iron condition that can affect the liver.  This is a blood test that she will need to have drawn at the hospital.  I have ordered it today.  Thank you

## 2020-07-22 NOTE — Telephone Encounter (Signed)
Phoned the pt LMOVM for the pt to return call regarding results of blood test

## 2020-07-24 NOTE — Telephone Encounter (Signed)
Phoned the pt, LMOVM for the pt to return call 

## 2020-07-24 NOTE — Telephone Encounter (Signed)
Pt returned the call and she was advised of her result note and that blood work has been put in at [facility] [employer]) and to go and have it completed. Pt agreed to do so.

## 2020-07-28 ENCOUNTER — Other Ambulatory Visit: Payer: Self-pay | Admitting: Internal Medicine

## 2020-08-04 LAB — HEMOCHROMATOSIS DNA-PCR(C282Y,H63D)

## 2020-08-13 NOTE — Patient Instructions (Addendum)
Melinda Giles  08/13/2020     @PREFPERIOPPHARMACY @   Your procedure is scheduled on  08/18/2020.   Report to 10/18/2020 at  0915  A.M.   Call this number if you have problems the morning of surgery:  [phone]   Remember:  Follow the diet and prep instructions given to you by the office.    Take these medicines the morning of surgery with A SIP OF WATER            naloxone, cymbalta, metoprolol, lyrica.     Do not wear jewelry, make-up or nail polish.  Do not wear lotions, powders, or perfumes, or deodorant.  Do not shave 48 hours prior to surgery.  Men may shave face and neck.  Do not bring valuables to the hospital.  [facility] is not responsible for any belongings or valuables.  Contacts, dentures or bridgework may not be worn into surgery.  Leave your suitcase in the car.  After surgery it may be brought to your room.  For patients admitted to the hospital, discharge time will be determined by your treatment team.  Patients discharged the day of surgery will not be allowed to drive home and must have someone with them for 24 hours.    Special instructions:    DO NOT smoke tobacco or vape for 24 hours before your procedure.   Please read over the following fact sheets that you were given. Anesthesia Post-op Instructions and Care and Recovery After Surgery       Colonoscopy, Adult, Care After This sheet gives you information about how to care for yourself after your procedure. Your health care provider may also give you more specific instructions. If you have problems or questions, contact your health careprovider. What can I expect after the procedure? After the procedure, it is common to have: A small amount of blood in your stool for 24 hours after the procedure. Some gas. Mild cramping or bloating of your abdomen. Follow these instructions at home: Eating and drinking  Drink enough fluid to keep your urine pale yellow. Follow instructions from  your health care provider about eating or drinking restrictions. Resume your normal diet as instructed by your health care provider. Avoid heavy or fried foods that are hard to digest.  Activity Rest as told by your health care provider. Avoid sitting for a long time without moving. Get up to take short walks every 1-2 hours. This is important to improve blood flow and breathing. Ask for help if you feel weak or unsteady. Return to your normal activities as told by your health care provider. Ask your health care provider what activities are safe for you. Managing cramping and bloating  Try walking around when you have cramps or feel bloated. Apply heat to your abdomen as told by your health care provider. Use the heat source that your health care provider recommends, such as a moist heat pack or a heating pad. Place a towel between your skin and the heat source. Leave the heat on for 20-30 minutes. Remove the heat if your skin turns bright red. This is especially important if you are unable to feel pain, heat, or cold. You may have a greater risk of getting burned.  General instructions If you were given a sedative during the procedure, it can affect you for several hours. Do not drive or operate machinery until your health care provider says that it is safe. For the first 24  hours after the procedure: Do not sign important documents. Do not drink alcohol. Do your regular daily activities at a slower pace than normal. Eat soft foods that are easy to digest. Take over-the-counter and prescription medicines only as told by your health care provider. Keep all follow-up visits as told by your health care provider. This is important. Contact a health care provider if: You have blood in your stool 2-3 days after the procedure. Get help right away if you have: More than a small spotting of blood in your stool. Large blood clots in your stool. Swelling of your abdomen. Nausea or vomiting. A  fever. Increasing pain in your abdomen that is not relieved with medicine. Summary After the procedure, it is common to have a small amount of blood in your stool. You may also have mild cramping and bloating of your abdomen. If you were given a sedative during the procedure, it can affect you for several hours. Do not drive or operate machinery until your health care provider says that it is safe. Get help right away if you have a lot of blood in your stool, nausea or vomiting, a fever, or increased pain in your abdomen. This information is not intended to replace advice given to you by your health care provider. Make sure you discuss any questions you have with your healthcare provider. Document Revised: 12/29/2018 Document Reviewed: 07/31/2018 Elsevier Patient Education  2022 [employer] Monitored Anesthesia Care, Care After This sheet gives you information about how to care for yourself after your procedure. Your health care provider may also give you more specific instructions. If you have problems or questions, contact your health careprovider. What can I expect after the procedure? After the procedure, it is common to have: Tiredness. Forgetfulness about what happened after the procedure. Impaired judgment for important decisions. Nausea or vomiting. Some difficulty with balance. Follow these instructions at home: For the time period you were told by your health care provider:     Rest as needed. Do not participate in activities where you could fall or become injured. Do not drive or use machinery. Do not drink alcohol. Do not take sleeping pills or medicines that cause drowsiness. Do not make important decisions or sign legal documents. Do not take care of children on your own. Eating and drinking Follow the diet that is recommended by your health care provider. Drink enough fluid to keep your urine pale yellow. If you vomit: Drink water, juice, or soup when you can drink  without vomiting. Make sure you have little or no nausea before eating solid foods. General instructions Have a responsible adult stay with you for the time you are told. It is important to have someone help care for you until you are awake and alert. Take over-the-counter and prescription medicines only as told by your health care provider. If you have sleep apnea, surgery and certain medicines can increase your risk for breathing problems. Follow instructions from your health care provider about wearing your sleep device: Anytime you are sleeping, including during daytime naps. While taking prescription pain medicines, sleeping medicines, or medicines that make you drowsy. Avoid smoking. Keep all follow-up visits as told by your health care provider. This is important. Contact a health care provider if: You keep feeling nauseous or you keep vomiting. You feel light-headed. You are still sleepy or having trouble with balance after 24 hours. You develop a rash. You have a fever. You have redness or swelling around the IV site.  Get help right away if: You have trouble breathing. You have new-onset confusion at home. Summary For several hours after your procedure, you may feel tired. You may also be forgetful and have poor judgment. Have a responsible adult stay with you for the time you are told. It is important to have someone help care for you until you are awake and alert. Rest as told. Do not drive or operate machinery. Do not drink alcohol or take sleeping pills. Get help right away if you have trouble breathing, or if you suddenly become confused. This information is not intended to replace advice given to you by your health care provider. Make sure you discuss any questions you have with your healthcare provider. Document Revised: 09/20/2019 Document Reviewed: 12/07/2018 Elsevier Patient Education  2022 [employer].

## 2020-08-14 ENCOUNTER — Encounter (HOSPITAL_COMMUNITY): Payer: Self-pay

## 2020-08-14 ENCOUNTER — Other Ambulatory Visit: Payer: Self-pay

## 2020-08-14 ENCOUNTER — Encounter (HOSPITAL_COMMUNITY)
Admission: RE | Admit: 2020-08-14 | Discharge: 2020-08-14 | Disposition: A | Discharge status: Home / Self Care | Payer: Medicare Other | Source: Ambulatory Visit | Attending: Internal Medicine | Admitting: Internal Medicine

## 2020-08-14 DIAGNOSIS — Z01818 Encounter for other preprocedural examination: Secondary | ICD-10-CM | POA: Diagnosis present

## 2020-08-14 HISTORY — DX: Type 2 diabetes mellitus without complications: E11.9

## 2020-08-14 LAB — BASIC METABOLIC PANEL
Anion gap: 13 (ref 5–15)
BUN: <5 mg/dL — ABNORMAL LOW (ref 6–20)
CO2: 22 mmol/L (ref 22–32)
Calcium: 9.2 mg/dL (ref 8.9–10.3)
Chloride: 99 mmol/L (ref 98–111)
Creatinine, Ser: 0.79 mg/dL (ref 0.44–1.00)
GFR, Estimated: >60 mL/min (ref >60)
Glucose, Bld: 142 mg/dL — ABNORMAL HIGH (ref 70–99)
Potassium: 2.7 mmol/L — CL (ref 3.5–5.1)
Sodium: 134 mmol/L — ABNORMAL LOW (ref 135–145)

## 2020-08-14 LAB — HCG, SERUM, QUALITATIVE: Preg, Serum: NEGATIVE

## 2020-08-14 NOTE — Pre-Procedure Instructions (Signed)
Bmet routed to Dr Carver. 

## 2020-08-17 ENCOUNTER — Encounter (HOSPITAL_COMMUNITY): Payer: Self-pay | Admitting: Anesthesiology

## 2020-08-18 ENCOUNTER — Telehealth: Payer: Self-pay | Admitting: Internal Medicine

## 2020-08-18 ENCOUNTER — Encounter (HOSPITAL_COMMUNITY)
Admission: RE | Disposition: A | Discharge status: Home / Self Care | Payer: Self-pay | Source: Home / Self Care | Attending: Internal Medicine

## 2020-08-18 ENCOUNTER — Ambulatory Visit (HOSPITAL_COMMUNITY)
Admission: RE | Admit: 2020-08-18 | Discharge: 2020-08-18 | Disposition: A | Discharge status: Home / Self Care | Payer: Medicare Other | Attending: Internal Medicine | Admitting: Internal Medicine

## 2020-08-18 DIAGNOSIS — Z538 Procedure and treatment not carried out for other reasons: Secondary | ICD-10-CM | POA: Insufficient documentation

## 2020-08-18 DIAGNOSIS — Z1211 Encounter for screening for malignant neoplasm of colon: Secondary | ICD-10-CM | POA: Diagnosis not present

## 2020-08-18 SURGERY — COLONOSCOPY WITH PROPOFOL
Anesthesia: Monitor Anesthesia Care

## 2020-08-18 MED ORDER — LACTATED RINGERS IV SOLN: INTRAVENOUS | Status: DC

## 2020-08-18 NOTE — Telephone Encounter (Signed)
Patient was given Trilyte with 1 1/2 day of clears. Fowarding to Dr. Marletta Lor for any further instructions prior to rescheduling.

## 2020-08-18 NOTE — OR Nursing (Signed)
Patient ambulated to pre-operative area with half of trilyte prep remaining in the bottle. Inquired about taking dulcolax and fleets enema. Pt states she did not do that. Pt reports brown colored watery stool. Dr. Marletta Lor notified and aware. After consulting with Dr. Marletta Lor informed patient of possibility of having to possibly repeat colonoscopy due to not completing prep. Pt verbalized understanding. Informed patient of either attempting to complete procedure today but if unsuccessful would have to return to complete exam at a different date. Informed patient of possibly trying a different prep, per Dr. Marletta Lor, if she wishes to reschedule the procedure to another date. Pt states she does not want to have to repeat it again and wishes to reschedule. Will call office to reach out to patient to reschedule for another day.

## 2020-08-18 NOTE — Telephone Encounter (Signed)
Pt had a poor prep and needs to be rescheduled

## 2020-08-27 DIAGNOSIS — A419 Sepsis, unspecified organism: Secondary | ICD-10-CM | POA: Insufficient documentation

## 2020-08-27 NOTE — H&P (Signed)
Patient presented today with colonoscopy though did not drink her prep properly.  She will be rescheduled.  No procedure today.

## 2020-08-28 NOTE — Telephone Encounter (Signed)
Per Dr. Marletta Lor give 2 days of clears and 1 1/2 preps  Called pt, LMOVM to call back to r/s TCS with propofol, asa 3

## 2020-08-29 NOTE — Telephone Encounter (Signed)
LMOVM letter mailed 

## 2020-10-10 ENCOUNTER — Emergency Department (HOSPITAL_COMMUNITY)
Admission: EM | Admit: 2020-10-10 | Discharge: 2020-10-10 | Disposition: A | Discharge status: Home / Self Care | Payer: Medicare Other | Attending: Emergency Medicine | Admitting: Emergency Medicine

## 2020-10-10 ENCOUNTER — Other Ambulatory Visit: Payer: Self-pay

## 2020-10-10 ENCOUNTER — Emergency Department (HOSPITAL_COMMUNITY): Payer: Medicare Other

## 2020-10-10 ENCOUNTER — Encounter (HOSPITAL_COMMUNITY): Payer: Self-pay

## 2020-10-10 DIAGNOSIS — R531 Weakness: Secondary | ICD-10-CM | POA: Insufficient documentation

## 2020-10-10 DIAGNOSIS — Z79899 Other long term (current) drug therapy: Secondary | ICD-10-CM | POA: Diagnosis not present

## 2020-10-10 DIAGNOSIS — I10 Essential (primary) hypertension: Secondary | ICD-10-CM | POA: Diagnosis not present

## 2020-10-10 DIAGNOSIS — F17210 Nicotine dependence, cigarettes, uncomplicated: Secondary | ICD-10-CM | POA: Insufficient documentation

## 2020-10-10 DIAGNOSIS — E119 Type 2 diabetes mellitus without complications: Secondary | ICD-10-CM | POA: Diagnosis not present

## 2020-10-10 HISTORY — DX: Unspecified cirrhosis of liver: K74.60

## 2020-10-10 HISTORY — DX: Schizophrenia, unspecified: F20.9

## 2020-10-10 HISTORY — DX: Alcohol dependence, in remission: F10.21

## 2020-10-10 HISTORY — DX: Encephalopathy, unspecified: G93.40

## 2020-10-10 LAB — AMMONIA: Ammonia: 18 umol/L (ref 9–35)

## 2020-10-10 LAB — CBC
HCT: 43.8 % (ref 36.0–46.0)
Hemoglobin: 15.4 g/dL — ABNORMAL HIGH (ref 12.0–15.0)
MCH: 34.4 pg — ABNORMAL HIGH (ref 26.0–34.0)
MCHC: 35.2 g/dL (ref 30.0–36.0)
MCV: 97.8 fL (ref 80.0–100.0)
Platelets: 242 10*3/uL (ref 150–400)
RBC: 4.48 MIL/uL (ref 3.87–5.11)
RDW: 13 % (ref 11.5–15.5)
WBC: 9.7 10*3/uL (ref 4.0–10.5)
nRBC: 0 % (ref 0.0–0.2)

## 2020-10-10 LAB — URINALYSIS, ROUTINE W REFLEX MICROSCOPIC
Bilirubin Urine: NEGATIVE
Glucose, UA: NEGATIVE mg/dL
Hgb urine dipstick: NEGATIVE
Ketones, ur: 5 mg/dL — AB
Nitrite: NEGATIVE
Protein, ur: NEGATIVE mg/dL
Specific Gravity, Urine: 1.016 (ref 1.005–1.030)
pH: 6 (ref 5.0–8.0)

## 2020-10-10 LAB — BASIC METABOLIC PANEL
Anion gap: 10 (ref 5–15)
BUN: 5 mg/dL — ABNORMAL LOW (ref 6–20)
CO2: 26 mmol/L (ref 22–32)
Calcium: 9 mg/dL (ref 8.9–10.3)
Chloride: 100 mmol/L (ref 98–111)
Creatinine, Ser: 0.8 mg/dL (ref 0.44–1.00)
GFR, Estimated: >60 mL/min (ref >60)
Glucose, Bld: 122 mg/dL — ABNORMAL HIGH (ref 70–99)
Potassium: 3.8 mmol/L (ref 3.5–5.1)
Sodium: 136 mmol/L (ref 135–145)

## 2020-10-10 LAB — CBG MONITORING, ED: Glucose-Capillary: 117 mg/dL — ABNORMAL HIGH (ref 70–99)

## 2020-10-10 LAB — ETHANOL: Alcohol, Ethyl (B): <10 mg/dL (ref <10)

## 2020-10-10 NOTE — Discharge Instructions (Addendum)
As discussed, continue taking your medication daily as directed.  Call Dr. Ronal Fear office on Monday to arrange a follow-up appointment.

## 2020-10-10 NOTE — ED Triage Notes (Signed)
Pt presents to ED for generalized weakness for a while. Pt checked out of [facility] last Friday and has been at home since. Pt was there for rehab due to weakness per family.

## 2020-10-10 NOTE — ED Provider Notes (Signed)
[facility] EMERGENCY DEPARTMENT Provider Note   CSN: [id number] Arrival date & time: 10/10/20  1055     History Chief Complaint  Patient presents with   Weakness    HILLIARY JOCK is a 50 y.o. female.   Weakness Associated symptoms: no abdominal pain, no arthralgias, no chest pain, no cough, no dizziness, no dysuria, no fever, no myalgias, no nausea, no shortness of breath and no vomiting       TYLYN STANKOVICH is a 50 y.o. female who presents to the Emergency Department from home complaining of gradually worsening generalized weakness.  She states that she has been having weakness for several months to years.  She was staying at a skilled nursing facility in [place] in August and then transferred to [facility].  She was discharged last Friday and has noticed worsening weakness since her discharge home.  She denies any headache, chest pain, shortness of breath, fever or chills.  She states that she has recently finished a course of antibiotics for urinary tract infection.  No recent alcohol use.  Family member who is at bedside also confirms that her weakness is worse than baseline.   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)    Cirrhosis (HCC)    Diabetes mellitus without complication (HCC)    Encephalopathy    Hypertension    Hypokalemia    Schizophrenia (HCC)     There are no problems to display for this patient.   Past Surgical History:  Procedure Laterality Date   CESAREAN SECTION  1993   HIP SURGERY Bilateral      OB History   No obstetric history on file.     History reviewed. No pertinent family history.  Social History   Tobacco Use   Smoking status: Every Day    Packs/day: 1.00    Years: 30.00    Pack years: 30.00    Types: Cigarettes   Smokeless tobacco: Never   Tobacco comments:    depression  Vaping Use   Vaping Use: Never used  Substance Use Topics   Alcohol use: Yes    Comment: [place] 3  drinks 3 times per week   Drug use: No    Home Medications Prior to Admission medications   Medication Sig Start Date End Date Taking? Authorizing Provider  DULoxetine (CYMBALTA) 30 MG capsule Take 30 mg by mouth in the morning and at bedtime.   Yes [provider]  fluticasone (FLONASE) 50 MCG/ACT nasal spray Place 2 sprays into both nostrils daily as needed for allergies or rhinitis.   Yes [provider]  loratadine (CLARITIN) 10 MG tablet Take 10 mg by mouth daily as needed for allergies.   Yes [provider]  metoprolol tartrate (LOPRESSOR) 50 MG tablet Take 1 tablet by mouth 2 (two) times daily. 08/04/17  Yes [provider]  norethindrone (MICRONOR) 0.35 MG tablet Take 1 tablet by mouth daily. 04/04/20  Yes [provider]  pregabalin (LYRICA) 150 MG capsule Take 150 mg by mouth in the morning and at bedtime.   Yes [provider]  Multiple Vitamin (MULTIVITAMIN) tablet Take 1 tablet by mouth daily. Patient not taking: Reported on 10/10/2020 09/28/10   Carleene Cooper, MD  polyethylene glycol-electrolytes (TRILYTE) 420 g solution Take 4,000 mLs by mouth as directed. Patient not taking: Reported on 10/10/2020 07/09/20   Lanelle Bal, DO    Allergies    Other  Review of Systems  Review of Systems  Constitutional:  Negative for chills, fatigue and fever.  HENT:  Negative for sore throat and trouble swallowing.   Respiratory:  Negative for cough, shortness of breath and wheezing.   Cardiovascular:  Negative for chest pain and palpitations.  Gastrointestinal:  Negative for abdominal pain, blood in stool, nausea and vomiting.  Genitourinary:  Negative for dysuria, flank pain and hematuria.  Musculoskeletal:  Negative for arthralgias, back pain, myalgias, neck pain and neck stiffness.  Skin:  Negative for rash.  Neurological:  Positive for weakness (generalized weakness). Negative for dizziness and numbness.  Hematological:  Does  not bruise/bleed easily.   Physical Exam Updated Vital Signs BP (!) 140/99   Pulse 78   Temp 98.3 F (36.8 C) (Oral)   Resp (!) 27   Wt 95.5 kg   SpO2 97%   BMI 35.04 kg/m   Physical Exam Vitals and nursing note reviewed.  Constitutional:      General: She is not in acute distress.    Appearance: Normal appearance. She is not ill-appearing or toxic-appearing.  HENT:     Head: Atraumatic.     Mouth/Throat:     Mouth: Mucous membranes are moist.  Eyes:     Extraocular Movements: Extraocular movements intact.     Conjunctiva/sclera: Conjunctivae normal.     Pupils: Pupils are equal, round, and reactive to light.  Neck:     Thyroid: No thyromegaly.     Meningeal: Kernig's sign absent.  Cardiovascular:     Rate and Rhythm: Normal rate and regular rhythm.     Pulses: Normal pulses.  Pulmonary:     Effort: Pulmonary effort is normal.     Breath sounds: Normal breath sounds. No wheezing.  Abdominal:     Palpations: Abdomen is soft.     Tenderness: There is no abdominal tenderness. There is no guarding or rebound.  Musculoskeletal:        General: No swelling. Normal range of motion.     Cervical back: Normal range of motion. No rigidity or tenderness.     Right lower leg: No edema.     Left lower leg: No edema.  Skin:    General: Skin is warm.     Capillary Refill: Capillary refill takes less than 2 seconds.     Findings: No bruising, erythema or rash.  Neurological:     General: No focal deficit present.     Mental Status: She is alert and oriented to person, place, and time.     GCS: GCS eye subscore is 4. GCS verbal subscore is 5. GCS motor subscore is 6.     Sensory: Sensation is intact.     Motor: Weakness present.     Gait: Gait abnormal.     Comments: Cranial nerves II through XII intact.  Speech clear.  No facial droop or pronator drift.  No unilateral extremity weakness.  Able to perform straight leg raise and hold for 4-5 seconds.  Ataxic gait    ED Results  / Procedures / Treatments   Labs (all labs ordered are listed, but only abnormal results are displayed) Labs Reviewed  BASIC METABOLIC PANEL - Abnormal; Notable for the following components:      Result Value   Glucose, Bld 122 (*)    BUN 5 (*)    All other components within normal limits  CBC - Abnormal; Notable for the following components:   Hemoglobin 15.4 (*)    MCH 34.4 (*)  All other components within normal limits  URINALYSIS, ROUTINE W REFLEX MICROSCOPIC - Abnormal; Notable for the following components:   Color, Urine AMBER (*)    APPearance HAZY (*)    Ketones, ur 5 (*)    Leukocytes,Ua SMALL (*)    Bacteria, UA RARE (*)    All other components within normal limits  CBG MONITORING, ED - Abnormal; Notable for the following components:   Glucose-Capillary 117 (*)    All other components within normal limits  ETHANOL  AMMONIA    EKG EKG Interpretation  Date/Time:  Friday October 10 2020 11:14:25 EDT Ventricular Rate:  97 PR Interval:  122 QRS Duration: 64 QT Interval:  439 QTC Calculation: 558 R Axis:   6 Text Interpretation: Sinus rhythm Low voltage, precordial leads Nonspecific T abnormalities, diffuse leads Prolonged QT interval No significant change since last tracing Except for prolonged QT Confirmed by Vanetta Mulders [id number]) on 10/10/2020 11:55:51 AM  Radiology MR BRAIN WO CONTRAST  Result Date: 10/10/2020 CLINICAL DATA:  Multiple sclerosis, new event. Additional provided: Generalized weakness. EXAM: MRI HEAD WITHOUT CONTRAST TECHNIQUE: Multiplanar, multiecho pulse sequences of the brain and surrounding structures were obtained without intravenous contrast. COMPARISON:  Brain MRI 10/20/2017. FINDINGS: Brain: Mild generalized cerebral and cerebellar atrophy. As before, there is very mild periventricular T2/FLAIR hyperintense signal abnormality. New from the prior exam, patchy T2 FLAIR hyperintense signal abnormality is now present within the pons. There is  no acute infarct. No evidence of an intracranial mass. No chronic intracranial blood products. No extra-axial fluid collection. No midline shift. Vascular: Maintained flow voids within the proximal large arterial vessels. Skull and upper cervical spine: No focal suspicious marrow lesion. Sinuses/Orbits: Visualized orbits show no acute finding. No significant paranasal sinus disease. Other: Trace fluid within the left mastoid air cells. IMPRESSION: Similar to the prior brain MRI of 10/20/2017, there is very mild periventricular T2 FLAIR hyperintense signal abnormality. New from this prior exam, patchy T2 FLAIR hyperintense signal abnormality is now present within the pons. These findings are nonspecific and may reflect sequela of reported multiple sclerosis. However, chronic small-vessel ischemic disease can also have this appearance. Mild generalized cerebral and cerebellar atrophy. Otherwise unremarkable MRI appearance of the brain. Trace fluid within the left mastoid air cells. Electronically Signed   By: Jackey Loge D.O.   On: 10/10/2020 17:26   MR Cervical Spine Wo Contrast  Result Date: 10/10/2020 CLINICAL DATA:  Motor neuron disease. Additional provided: Generalized weakness. EXAM: MRI CERVICAL SPINE WITHOUT CONTRAST TECHNIQUE: Multiplanar, multisequence MR imaging of the cervical spine was performed. No intravenous contrast was administered. COMPARISON:  MRI of the cervical spine 10/20/2017. FINDINGS: Mildly motion degraded exam. Alignment: Straightening of the expected cervical lordosis. No significant spondylolisthesis. Vertebrae: Vertebral body height is maintained. No significant marrow edema or focal suspicious osseous lesion. Cord: No spinal cord signal abnormality is identified. Posterior Fossa, vertebral arteries, paraspinal tissues: Posterior fossa better assessed on same-day brain MRI. Flow voids preserved within the imaged cervical vertebral arteries. Paraspinal soft tissues unremarkable. Disc  levels: Unless otherwise stated, the level by level findings below have not significantly changed from the prior MRI of 10/20/2017. No more than mild disc degeneration at any level. C2-C3: Shallow disc bulge. No significant spinal canal or foraminal stenosis. C3-C4: Shallow disc bulge. Mild uncovertebral hypertrophy on the right. Superimposed small shallow central disc protrusion. Minimal partial effacement of the ventral thecal sac (without spinal cord mass effect). No significant foraminal stenosis. C4-C5: Shallow disc bulge. Mild bilateral  uncovertebral hypertrophy. Minimal partial effacement of the ventral thecal sac (without spinal cord mass effect). Mild right neural foraminal narrowing. C5-C6: Shallow disc bulge. Superimposed small central disc protrusion eccentric to the left. Progressive uncovertebral hypertrophy (greater on the right). Mild relative spinal canal narrowing (without spinal cord mass effect). Progressive moderate right neural foraminal narrowing. C6-C7: Shallow disc bulge. Superimposed small central disc protrusion. Progressive uncovertebral hypertrophy (greater on the right). Mild relative spinal canal narrowing (without spinal cord mass effect). Progressive mild/moderate right neural foraminal narrowing. C7-T1: No significant disc herniation or stenosis. IMPRESSION: Mildly motion degraded exam. No signal abnormality is identified within the cervical spinal cord. Cervical spondylosis, as outlined and having slightly progressed at several levels since the prior MRI of 10/20/2017. No more than mild spinal canal stenosis (without spinal cord mass effect). Neural foraminal narrowing, greatest on the right at C5-C6 (moderate), and on the right at C6-C7 (mild/moderate). Nonspecific straightening of the expected cervical lordosis. Electronically Signed   By: Jackey Loge D.O.   On: 10/10/2020 17:21    Procedures Procedures   Medications Ordered in ED Medications - No data to display  ED  Course  I have reviewed the triage vital signs and the nursing notes.  Pertinent labs & imaging results that were available during my care of the patient were reviewed by me and considered in my medical decision making (see chart for details).    MDM Rules/Calculators/A&P                           Patient here with reports of generalized weakness for some time gradually worsening.  Discharged from [facility] 1 week ago. There for rehab for weakness.  Denies pain  On exam, patient without focal neurodeficits.  She is mentating well, no nuchal rigidity.  Labs interpreted by me, without evidence of leukocytosis, EtOH unremarkable.  Ammonia unremarkable.  EKG without acute ischemic change.  On review of medical records, patient was seen in 2019 for numbness and generalized weakness as well MRI at that time without acute findings.  Patient's sister who is now at bedside also provide some medical history.  States that her sister's weakness recently worse after developing a UTI.  She recently completed course of antibiotics. Denies dysuria at this time I have attempted to ambulate patient with assistance.  Upon standing, Patient requires assistance, she is ataxic, very unsteady and only able to make a few steps.  MRI of brain and C-spine today show very mild periventricular T2 flair hyperintense signal abnormality similar to MRI from 2019.  New, patchy T2 flair now present in the pons.  These findings may be nonspecific, will consult neurology.  Discussed findings with Dr. Otelia Limes. Who reviewed imaging, felt that pt appropriate for d/c home and closely f/u as outpatient with her neurologist, Dr. Gerilyn Pilgrim.  Pt prefers this plan as well.  She will continue her Lyrica as directed.     Final Clinical Impression(s) / ED Diagnoses Final diagnoses:  Generalized weakness    Rx / DC Orders ED Discharge Orders     None        Rosey Bath 10/11/20 2147    Vanetta Mulders, MD 10/20/20  [phone]

## 2020-10-12 LAB — URINE CULTURE

## 2020-10-23 ENCOUNTER — Other Ambulatory Visit (HOSPITAL_COMMUNITY): Payer: Self-pay | Admitting: Internal Medicine

## 2020-10-23 DIAGNOSIS — Z1231 Encounter for screening mammogram for malignant neoplasm of breast: Secondary | ICD-10-CM

## 2020-11-03 ENCOUNTER — Encounter: Payer: Self-pay | Admitting: *Deleted

## 2020-11-09 DIAGNOSIS — R569 Unspecified convulsions: Secondary | ICD-10-CM | POA: Insufficient documentation

## 2020-11-09 DIAGNOSIS — R4182 Altered mental status, unspecified: Secondary | ICD-10-CM | POA: Insufficient documentation

## 2020-11-10 ENCOUNTER — Inpatient Hospital Stay (HOSPITAL_COMMUNITY): Admission: RE | Admit: 2020-11-10 | Payer: Medicaid Other | Source: Ambulatory Visit

## 2020-11-20 ENCOUNTER — Encounter: Payer: Self-pay | Admitting: *Deleted

## 2020-11-20 ENCOUNTER — Ambulatory Visit: Payer: Medicare Other

## 2020-12-08 ENCOUNTER — Other Ambulatory Visit (HOSPITAL_COMMUNITY): Payer: Self-pay | Admitting: Neurology

## 2020-12-08 ENCOUNTER — Other Ambulatory Visit: Payer: Self-pay | Admitting: Neurology

## 2020-12-08 DIAGNOSIS — Q282 Arteriovenous malformation of cerebral vessels: Secondary | ICD-10-CM

## 2020-12-08 DIAGNOSIS — I639 Cerebral infarction, unspecified: Secondary | ICD-10-CM

## 2020-12-19 ENCOUNTER — Ambulatory Visit (HOSPITAL_COMMUNITY)
Admission: RE | Admit: 2020-12-19 | Discharge: 2020-12-19 | Disposition: A | Discharge status: Home / Self Care | Payer: Medicare Other | Source: Ambulatory Visit | Attending: Neurology | Admitting: Neurology

## 2020-12-19 ENCOUNTER — Other Ambulatory Visit: Payer: Self-pay

## 2020-12-19 DIAGNOSIS — I639 Cerebral infarction, unspecified: Secondary | ICD-10-CM | POA: Diagnosis present

## 2020-12-19 DIAGNOSIS — Q282 Arteriovenous malformation of cerebral vessels: Secondary | ICD-10-CM | POA: Diagnosis not present

## 2020-12-24 DIAGNOSIS — K7460 Unspecified cirrhosis of liver: Secondary | ICD-10-CM | POA: Insufficient documentation

## 2021-01-26 ENCOUNTER — Encounter: Payer: Self-pay | Admitting: Orthopaedic Surgery

## 2021-01-28 ENCOUNTER — Ambulatory Visit (HOSPITAL_COMMUNITY)
Admission: RE | Admit: 2021-01-28 | Discharge: 2021-01-28 | Disposition: A | Discharge status: Home / Self Care | Payer: Medicare (Managed Care) | Source: Ambulatory Visit | Attending: Internal Medicine | Admitting: Internal Medicine

## 2021-01-28 ENCOUNTER — Other Ambulatory Visit: Payer: Self-pay

## 2021-01-28 DIAGNOSIS — Z1231 Encounter for screening mammogram for malignant neoplasm of breast: Secondary | ICD-10-CM | POA: Insufficient documentation

## 2021-02-03 ENCOUNTER — Encounter: Payer: Self-pay | Admitting: Orthopaedic Surgery

## 2021-02-03 ENCOUNTER — Ambulatory Visit (INDEPENDENT_AMBULATORY_CARE_PROVIDER_SITE_OTHER): Payer: Medicare (Managed Care) | Admitting: Orthopaedic Surgery

## 2021-02-03 ENCOUNTER — Ambulatory Visit: Payer: Medicare (Managed Care)

## 2021-02-03 ENCOUNTER — Other Ambulatory Visit: Payer: Self-pay

## 2021-02-03 VITALS — BP 131/91 | HR 88 | Ht 65.0 in | Wt 216.0 lb

## 2021-02-03 DIAGNOSIS — M25552 Pain in left hip: Secondary | ICD-10-CM

## 2021-02-03 DIAGNOSIS — M87052 Idiopathic aseptic necrosis of left femur: Secondary | ICD-10-CM

## 2021-02-03 DIAGNOSIS — M87051 Idiopathic aseptic necrosis of right femur: Secondary | ICD-10-CM | POA: Diagnosis not present

## 2021-02-03 DIAGNOSIS — M25551 Pain in right hip: Secondary | ICD-10-CM | POA: Diagnosis not present

## 2021-02-03 DIAGNOSIS — G8929 Other chronic pain: Secondary | ICD-10-CM

## 2021-02-03 DIAGNOSIS — F209 Schizophrenia, unspecified: Secondary | ICD-10-CM | POA: Insufficient documentation

## 2021-02-03 NOTE — Progress Notes (Signed)
Subjective:    Patient ID: Melinda Giles, female    DOB: 06/21/1970, 51 y.o.   MRN: [id number]  HPI She has a history of altered mental state.  Her history is obtained from her sister who is present.  Patient had hip surgery when she was a pre-teen.  She has begun to have more hip pain on the left but some on the right over the last month to six weeks.  She slept on a sleeper sofa then and the pain began.  Patient's memory is not good.  But she has been limping on the left hip over the last few weeks.  There is no history of falls.  I have read Dr. Letitia NeriFanta's notes.  She must have had slipped capital femoral epiphysis in the late 70's or early 80's.  I came here in 1980 and may have been involved with her care, I do not remember.  Records do not go back that far that we can easily access.  Patient had pinning of the hips, then removal of screws from the right side but one pin broke.  She has done fairly well with the hips over the years until recently.    She is in pain and cannot walk far.  She uses a cane.   Review of Systems  Constitutional:  Positive for activity change.  Musculoskeletal:  Positive for arthralgias, gait problem, joint swelling and myalgias.  Psychiatric/Behavioral:  Positive for confusion.        History of schizophrenia.  All other systems reviewed and are negative. For Review of Systems, all other systems reviewed and are negative.  The following is a summary of the past history medically, past history surgically, known current medicines, social history and family history.  This information is gathered electronically by the computer from prior information and documentation.  I review this each visit and have found including this information at this point in the chart is beneficial and informative.   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)    Cirrhosis (HCC)    Diabetes mellitus without complication (HCC)     Encephalopathy    Hypertension    Hypokalemia    Schizophrenia (HCC)     Past Surgical History:  Procedure Laterality Date   CESAREAN SECTION  1993   HIP SURGERY Bilateral     Current Outpatient Medications on File Prior to Visit  Medication Sig Dispense Refill   DULoxetine (CYMBALTA) 30 MG capsule Take 30 mg by mouth in the morning and at bedtime.     fluticasone (FLONASE) 50 MCG/ACT nasal spray Place 2 sprays into both nostrils daily as needed for allergies or rhinitis.     loratadine (CLARITIN) 10 MG tablet Take 10 mg by mouth daily as needed for allergies.     metoprolol tartrate (LOPRESSOR) 50 MG tablet Take 1 tablet by mouth 2 (two) times daily.  1   Multiple Vitamin (MULTIVITAMIN) tablet Take 1 tablet by mouth daily. 30 tablet 2   norethindrone (MICRONOR) 0.35 MG tablet Take 1 tablet by mouth daily.     polyethylene glycol-electrolytes (TRILYTE) 420 g solution Take 4,000 mLs by mouth as directed. 4000 mL 0   pregabalin (LYRICA) 150 MG capsule Take 150 mg by mouth in the morning and at bedtime.     No current facility-administered medications on file prior to visit.    Social History   Socioeconomic History   Marital status: Single    Spouse  name: Not on file   Number of children: Not on file   Years of education: Not on file   Highest education level: Not on file  Occupational History   Not on file  Tobacco Use   Smoking status: Every Day    Packs/day: 1.00    Years: 30.00    Pack years: 30.00    Types: Cigarettes   Smokeless tobacco: Never   Tobacco comments:    depression  Vaping Use   Vaping Use: Never used  Substance and Sexual Activity   Alcohol use: Yes    Comment: coctails/margaritas 3 drinks 3 times per week   Drug use: No   Sexual activity: Yes    Birth control/protection: Pill  Other Topics Concern   Not on file  Social History Narrative   Not on file   Social Determinants of Health   Financial Resource Strain: Not on file  Food  Insecurity: Not on file  Transportation Needs: Not on file  Physical Activity: Not on file  Stress: Not on file  Social Connections: Not on file  Intimate Partner Violence: Not on file    History reviewed. No pertinent family history.  BP (!) 131/91    Pulse 88    Ht 5\' 5"  (1.651 m)    Wt 216 lb (98 kg)    BMI 35.94 kg/m   Body mass index is 35.94 kg/m.     Objective:   Physical Exam Vitals and nursing note reviewed. Exam conducted with a chaperone present.  Constitutional:      Appearance: She is well-developed.  HENT:     Head: Normocephalic and atraumatic.  Eyes:     Conjunctiva/sclera: Conjunctivae normal.     Pupils: Pupils are equal, round, and reactive to light.  Cardiovascular:     Rate and Rhythm: Normal rate and regular rhythm.  Pulmonary:     Effort: Pulmonary effort is normal.  Abdominal:     Palpations: Abdomen is soft.  Musculoskeletal:     Cervical back: Normal range of motion and neck supple.       Legs:  Skin:    General: Skin is warm and dry.  Neurological:     Mental Status: She is alert and oriented to person, place, and time.     Cranial Nerves: No cranial nerve deficit.     Motor: No abnormal muscle tone.     Coordination: Coordination normal.     Deep Tendon Reflexes: Reflexes are normal and symmetric. Reflexes normal.  Psychiatric:        Behavior: Behavior normal.        Thought Content: Thought content normal.        Judgment: Judgment normal.  X-rays were done of the pelvis and left hip, reported separately.        Assessment & Plan:   Encounter Diagnoses  Name Primary?   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  I have explained the findings.  I will get MRIs of the hips.  She may need total hips if she is a candidate.  That  remains to be seen.  I will give Rx for walker.  Return in two weeks.  Call if any problem.  Precautions  discussed.  Electronically Signed Darreld Mclean, MD 1/17/20234:53 PM

## 2021-02-03 NOTE — Patient Instructions (Signed)
While we are working on your approval for MRI please go ahead and call to schedule your appointment with [facility] within at least one (1) week.   Central Scheduling [phone]  

## 2021-02-16 ENCOUNTER — Ambulatory Visit (HOSPITAL_COMMUNITY): Payer: Medicare (Managed Care)

## 2021-02-19 ENCOUNTER — Ambulatory Visit: Payer: Medicare (Managed Care) | Admitting: Orthopaedic Surgery

## 2021-02-25 ENCOUNTER — Ambulatory Visit (HOSPITAL_COMMUNITY)
Admission: RE | Admit: 2021-02-25 | Discharge: 2021-02-25 | Disposition: A | Discharge status: Home / Self Care | Payer: Medicare (Managed Care) | Source: Ambulatory Visit | Attending: Orthopaedic Surgery | Admitting: Orthopaedic Surgery

## 2021-02-25 ENCOUNTER — Other Ambulatory Visit: Payer: Self-pay

## 2021-02-25 DIAGNOSIS — G8929 Other chronic pain: Secondary | ICD-10-CM | POA: Insufficient documentation

## 2021-02-25 DIAGNOSIS — M87052 Idiopathic aseptic necrosis of left femur: Secondary | ICD-10-CM | POA: Insufficient documentation

## 2021-02-25 DIAGNOSIS — M25552 Pain in left hip: Secondary | ICD-10-CM | POA: Diagnosis present

## 2021-02-25 DIAGNOSIS — M25551 Pain in right hip: Secondary | ICD-10-CM | POA: Diagnosis present

## 2021-02-25 DIAGNOSIS — M87051 Idiopathic aseptic necrosis of right femur: Secondary | ICD-10-CM | POA: Diagnosis present

## 2021-03-03 ENCOUNTER — Encounter: Payer: Self-pay | Admitting: Orthopaedic Surgery

## 2021-03-03 ENCOUNTER — Other Ambulatory Visit: Payer: Self-pay

## 2021-03-03 ENCOUNTER — Ambulatory Visit (INDEPENDENT_AMBULATORY_CARE_PROVIDER_SITE_OTHER): Payer: Medicare (Managed Care) | Admitting: Orthopaedic Surgery

## 2021-03-03 DIAGNOSIS — R4189 Other symptoms and signs involving cognitive functions and awareness: Secondary | ICD-10-CM

## 2021-03-03 DIAGNOSIS — M160 Bilateral primary osteoarthritis of hip: Secondary | ICD-10-CM | POA: Diagnosis not present

## 2021-03-03 DIAGNOSIS — G8929 Other chronic pain: Secondary | ICD-10-CM

## 2021-03-03 DIAGNOSIS — M25552 Pain in left hip: Secondary | ICD-10-CM

## 2021-03-03 NOTE — Progress Notes (Signed)
My hip hurts.  She is accompanied by her sister.  The patient has had memory problems and her sister represents her.  She had MRI of the hips and it showed: IMPRESSION: 1. Severe osteoarthritis of the left hip. Moderate left hip joint effusion with synovitis. 2. Moderate-severe osteoarthritis of the right hip. 3. IMPRESSION: 1. Severe osteoarthritis of the left hip. Moderate left hip joint effusion with synovitis. 2. Moderate-severe osteoarthritis of the right hip. 3. No hip fracture, dislocation or avascular necrosis.No hip fracture, dislocation or avascular necrosis.  I have explained the findings.  I would recommend consideration of total hip.  But she will need medical clearance.  Her memory is slowly improving.  Her sister lives in [place] and travels here for recent death of both of her parents and to help care for her sister.  I have independently reviewed the MRI.    She  uses a cane on the left.  Left hip has very little motion internal and external and right hip has some better motion but still limited.   Limp left.  NV intact, no distal edema.  Encounter Diagnoses  Name Primary?   Chronic left hip pain Yes   Chronic right hip pain    Cognitive changes    I would recommend they carefully consider possible surgery.  I would have Dr. Magnus Ivan see her when they decide what to do.  I have answered many questions they had.  Call if any problem.  Precautions discussed.  Electronically Signed Darreld Mclean, MD 2/14/20231:41 PM

## 2021-03-03 NOTE — Addendum Note (Signed)
Addended by: Baird Kay on: 03/03/2021 01:59 PM   Modules accepted: Orders

## 2021-03-16 ENCOUNTER — Ambulatory Visit (INDEPENDENT_AMBULATORY_CARE_PROVIDER_SITE_OTHER): Payer: Medicare (Managed Care) | Admitting: Orthopaedic Surgery

## 2021-03-16 ENCOUNTER — Encounter: Payer: Self-pay | Admitting: Orthopaedic Surgery

## 2021-03-16 VITALS — Ht 65.0 in | Wt 216.0 lb

## 2021-03-16 DIAGNOSIS — M25552 Pain in left hip: Secondary | ICD-10-CM

## 2021-03-16 DIAGNOSIS — M1612 Unilateral primary osteoarthritis, left hip: Secondary | ICD-10-CM | POA: Diagnosis not present

## 2021-03-16 DIAGNOSIS — M1611 Unilateral primary osteoarthritis, right hip: Secondary | ICD-10-CM | POA: Diagnosis not present

## 2021-03-16 DIAGNOSIS — G8929 Other chronic pain: Secondary | ICD-10-CM

## 2021-03-16 DIAGNOSIS — M25551 Pain in right hip: Secondary | ICD-10-CM | POA: Diagnosis not present

## 2021-03-16 DIAGNOSIS — Z969 Presence of functional implant, unspecified: Secondary | ICD-10-CM
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
## 2021-04-21 DIAGNOSIS — D649 Anemia, unspecified: Secondary | ICD-10-CM | POA: Diagnosis not present

## 2021-04-21 DIAGNOSIS — Z0001 Encounter for general adult medical examination with abnormal findings: Secondary | ICD-10-CM | POA: Diagnosis not present

## 2021-04-21 DIAGNOSIS — Z1389 Encounter for screening for other disorder: Secondary | ICD-10-CM | POA: Diagnosis not present

## 2021-04-21 DIAGNOSIS — I10 Essential (primary) hypertension: Secondary | ICD-10-CM | POA: Diagnosis not present

## 2021-04-29 ENCOUNTER — Encounter: Payer: Self-pay | Admitting: Obstetrics & Gynecology

## 2021-04-29 ENCOUNTER — Other Ambulatory Visit (HOSPITAL_COMMUNITY)
Admission: RE | Admit: 2021-04-29 | Discharge: 2021-04-29 | Disposition: A | Discharge status: Home / Self Care | Payer: Medicare Other | Source: Ambulatory Visit | Attending: Obstetrics & Gynecology | Admitting: Obstetrics & Gynecology

## 2021-04-29 ENCOUNTER — Ambulatory Visit (INDEPENDENT_AMBULATORY_CARE_PROVIDER_SITE_OTHER): Payer: Medicare Other | Admitting: Obstetrics & Gynecology

## 2021-04-29 VITALS — BP 107/74 | HR 81 | Ht 65.0 in | Wt 226.8 lb

## 2021-04-29 DIAGNOSIS — Z1211 Encounter for screening for malignant neoplasm of colon: Secondary | ICD-10-CM

## 2021-04-29 DIAGNOSIS — Z01419 Encounter for gynecological examination (general) (routine) without abnormal findings: Secondary | ICD-10-CM

## 2021-04-29 DIAGNOSIS — Z1212 Encounter for screening for malignant neoplasm of rectum: Secondary | ICD-10-CM

## 2021-04-29 DIAGNOSIS — Z1151 Encounter for screening for human papillomavirus (HPV): Secondary | ICD-10-CM | POA: Insufficient documentation

## 2021-04-29 LAB — HEMOCCULT GUIAC POC 1CARD (OFFICE): Fecal Occult Blood, POC: NEGATIVE
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
## 2021-05-01 LAB — CYTOLOGY - PAP
Comment: NEGATIVE
Diagnosis: NEGATIVE
High risk HPV: NEGATIVE

## 2021-05-04 ENCOUNTER — Telehealth: Payer: Self-pay

## 2021-05-04 ENCOUNTER — Telehealth: Payer: Self-pay | Admitting: Obstetrics & Gynecology

## 2021-05-04 NOTE — Telephone Encounter (Signed)
Pt returned vm. Returned pt's call, two identifiers used. Informed pt of pap smear test results per Dr Charlotta Newton. Pt and sister on phone, both confirmed understanding. All questions answered. ?

## 2021-05-04 NOTE — Telephone Encounter (Signed)
Pt called, no answer, left vm

## 2021-05-04 NOTE — Telephone Encounter (Signed)
-----   Message from Janyth Pupa, DO sent at 05/01/2021  1:22 PM EDT ----- ?Pap/HPV negative ?

## 2021-05-04 NOTE — Telephone Encounter (Signed)
Patient was returning your call about her labs  ?

## 2021-05-11 ENCOUNTER — Encounter: Payer: Self-pay | Admitting: Obstetrics & Gynecology

## 2021-05-11 ENCOUNTER — Ambulatory Visit (INDEPENDENT_AMBULATORY_CARE_PROVIDER_SITE_OTHER): Payer: Medicare Other | Admitting: Obstetrics & Gynecology

## 2021-05-11 VITALS — BP 121/82 | HR 73 | Ht 65.0 in | Wt 243.6 lb

## 2021-05-11 DIAGNOSIS — N926 Irregular menstruation, unspecified: Secondary | ICD-10-CM

## 2021-05-11 MED ORDER — MEDROXYPROGESTERONE ACETATE 150 MG/ML IM SUSP: 150.0000 mg | INTRAMUSCULAR | 3 refills | Status: DC

## 2021-05-11 MED ORDER — IBUPROFEN 800 MG PO TABS: 800.0000 mg | ORAL_TABLET | Freq: Three times a day (TID) | ORAL | 2 refills | Status: DC | PRN

## 2021-05-11 MED ORDER — MEDROXYPROGESTERONE ACETATE 150 MG/ML IM SUSP
150.0000 mg | Freq: Once | INTRAMUSCULAR | Status: AC
  Administered 2021-05-11: 150 mg via INTRAMUSCULAR
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
## 2021-05-11 NOTE — Addendum Note (Signed)
Addended by: Annamarie Dawley on: 05/11/2021 03:26 PM ? ? Modules accepted: Orders ? ?

## 2021-05-22 DIAGNOSIS — I10 Essential (primary) hypertension: Secondary | ICD-10-CM | POA: Diagnosis not present

## 2021-05-22 DIAGNOSIS — R569 Unspecified convulsions: Secondary | ICD-10-CM | POA: Diagnosis not present

## 2021-06-18 DIAGNOSIS — M79643 Pain in unspecified hand: Secondary | ICD-10-CM | POA: Diagnosis not present

## 2021-06-18 DIAGNOSIS — M79606 Pain in leg, unspecified: Secondary | ICD-10-CM | POA: Diagnosis not present

## 2021-06-18 DIAGNOSIS — R2689 Other abnormalities of gait and mobility: Secondary | ICD-10-CM | POA: Diagnosis not present

## 2021-06-22 DIAGNOSIS — I10 Essential (primary) hypertension: Secondary | ICD-10-CM | POA: Diagnosis not present

## 2021-06-22 DIAGNOSIS — R569 Unspecified convulsions: Secondary | ICD-10-CM | POA: Diagnosis not present

## 2021-07-22 DIAGNOSIS — I10 Essential (primary) hypertension: Secondary | ICD-10-CM | POA: Diagnosis not present

## 2021-07-22 DIAGNOSIS — R569 Unspecified convulsions: Secondary | ICD-10-CM | POA: Diagnosis not present

## 2021-08-03 ENCOUNTER — Ambulatory Visit: Payer: Medicare Other

## 2021-08-05 ENCOUNTER — Ambulatory Visit (INDEPENDENT_AMBULATORY_CARE_PROVIDER_SITE_OTHER): Payer: Medicare Other | Admitting: *Deleted

## 2021-08-05 VITALS — Wt 237.0 lb

## 2021-08-05 DIAGNOSIS — Z3042 Encounter for surveillance of injectable contraceptive: Secondary | ICD-10-CM

## 2021-08-05 MED ORDER — MEDROXYPROGESTERONE ACETATE 150 MG/ML IM SUSP
150.0000 mg | Freq: Once | INTRAMUSCULAR | Status: AC
  Administered 2021-08-05: 150 mg via INTRAMUSCULAR

## 2021-08-05 NOTE — Progress Notes (Signed)
   NURSE VISIT- INJECTION  SUBJECTIVE:  Melinda Giles is a 51 y.o. G30P1001 female here for a Depo Provera for contraception/period management. She is a GYN patient.   OBJECTIVE:  Wt 237 lb (107.5 kg)   LMP  (LMP Unknown) Comment: > 2 years ago  BMI 39.44 kg/m   Appears well, in no apparent distress  Injection administered in: Right deltoid  Meds ordered this encounter  Medications   medroxyPROGESTERone (DEPO-PROVERA) injection 150 mg    ASSESSMENT: GYN patient Depo Provera for contraception/period management PLAN: Follow-up: in 11-13 weeks for next Depo   Annamarie Dawley  08/05/2021 8:33 AM

## 2021-08-06 ENCOUNTER — Ambulatory Visit: Payer: Medicare Other

## 2021-08-22 DIAGNOSIS — D649 Anemia, unspecified: Secondary | ICD-10-CM | POA: Diagnosis not present

## 2021-08-22 DIAGNOSIS — I10 Essential (primary) hypertension: Secondary | ICD-10-CM | POA: Diagnosis not present

## 2021-09-09 DIAGNOSIS — M79606 Pain in leg, unspecified: Secondary | ICD-10-CM | POA: Diagnosis not present

## 2021-09-09 DIAGNOSIS — M79643 Pain in unspecified hand: Secondary | ICD-10-CM | POA: Diagnosis not present

## 2021-09-09 DIAGNOSIS — R2689 Other abnormalities of gait and mobility: Secondary | ICD-10-CM | POA: Diagnosis not present

## 2021-10-20 DIAGNOSIS — Z23 Encounter for immunization: Secondary | ICD-10-CM | POA: Diagnosis not present

## 2021-10-20 DIAGNOSIS — G629 Polyneuropathy, unspecified: Secondary | ICD-10-CM | POA: Diagnosis not present

## 2021-10-20 DIAGNOSIS — I10 Essential (primary) hypertension: Secondary | ICD-10-CM | POA: Diagnosis not present

## 2021-10-28 ENCOUNTER — Ambulatory Visit (INDEPENDENT_AMBULATORY_CARE_PROVIDER_SITE_OTHER): Payer: Medicare Other | Admitting: *Deleted

## 2021-10-28 DIAGNOSIS — Z3042 Encounter for surveillance of injectable contraceptive: Secondary | ICD-10-CM

## 2021-10-28 MED ORDER — MEDROXYPROGESTERONE ACETATE 150 MG/ML IM SUSP
150.0000 mg | Freq: Once | INTRAMUSCULAR | Status: AC
  Administered 2021-10-28: 150 mg via INTRAMUSCULAR

## 2021-10-28 NOTE — Progress Notes (Signed)
   NURSE VISIT- INJECTION  SUBJECTIVE:  Melinda Giles is a 51 y.o. G69P1001 female here for a Depo Provera for contraception/period management. She is a GYN patient.   OBJECTIVE:  LMP  (LMP Unknown) Comment: > 2 years ago  Appears well, in no apparent distress  Injection administered in: Left deltoid  Meds ordered this encounter  Medications   medroxyPROGESTERone (DEPO-PROVERA) injection 150 mg    ASSESSMENT: GYN patient Depo Provera for contraception/period management PLAN: Follow-up: in 11-13 weeks for next Depo   Alice Rieger  10/28/2021 9:47 AM

## 2021-11-20 DIAGNOSIS — I10 Essential (primary) hypertension: Secondary | ICD-10-CM | POA: Diagnosis not present

## 2021-11-20 DIAGNOSIS — R569 Unspecified convulsions: Secondary | ICD-10-CM | POA: Diagnosis not present

## 2021-12-20 DIAGNOSIS — R569 Unspecified convulsions: Secondary | ICD-10-CM | POA: Diagnosis not present

## 2021-12-20 DIAGNOSIS — I10 Essential (primary) hypertension: Secondary | ICD-10-CM | POA: Diagnosis not present

## 2022-01-20 DIAGNOSIS — I10 Essential (primary) hypertension: Secondary | ICD-10-CM | POA: Diagnosis not present

## 2022-01-20 DIAGNOSIS — R569 Unspecified convulsions: Secondary | ICD-10-CM | POA: Diagnosis not present

## 2022-01-21 ENCOUNTER — Ambulatory Visit (INDEPENDENT_AMBULATORY_CARE_PROVIDER_SITE_OTHER): Payer: Medicare Other

## 2022-01-21 DIAGNOSIS — Z3042 Encounter for surveillance of injectable contraceptive: Secondary | ICD-10-CM | POA: Diagnosis not present

## 2022-01-21 MED ORDER — MEDROXYPROGESTERONE ACETATE 150 MG/ML IM SUSP
150.0000 mg | Freq: Once | INTRAMUSCULAR | Status: AC
  Administered 2022-01-21: 150 mg via INTRAMUSCULAR

## 2022-01-21 NOTE — Progress Notes (Signed)
   NURSE VISIT- INJECTION  SUBJECTIVE:  Melinda Giles is a 52 y.o. G65P1001 female here for a Depo Provera for contraception/period management. She is a GYN patient.   OBJECTIVE:  LMP  (LMP Unknown) Comment: > 2 years ago  Appears well, in no apparent distress  Injection administered in: Left deltoid  Meds ordered this encounter  Medications   medroxyPROGESTERone (DEPO-PROVERA) injection 150 mg    ASSESSMENT: GYN patient Depo Provera for contraception/period management PLAN: Follow-up: in 11-13 weeks for next Depo   Andrez Grime  01/21/2022 10:02 AM

## 2022-02-20 DIAGNOSIS — R569 Unspecified convulsions: Secondary | ICD-10-CM | POA: Diagnosis not present

## 2022-02-20 DIAGNOSIS — I10 Essential (primary) hypertension: Secondary | ICD-10-CM | POA: Diagnosis not present

## 2022-03-21 DIAGNOSIS — R569 Unspecified convulsions: Secondary | ICD-10-CM | POA: Diagnosis not present

## 2022-03-21 DIAGNOSIS — I10 Essential (primary) hypertension: Secondary | ICD-10-CM | POA: Diagnosis not present

## 2022-03-25 IMAGING — MR MR HIP*L* W/O CM
5 series · 40 of 40 positions shown · non-contrast
Comparison: None.

CLINICAL DATA: Chronic bilateral hip pain. Preop evaluation for
avascular necrosis.

EXAM:
MR OF THE RIGHT HIP WITHOUT CONTRAST
MR OF THE LEFT HIP WITHOUT CONTRAST
TECHNIQUE: Multiplanar, multisequence MR imaging of the right hip was
performed. No intravenous contrast was administered.
Multiplanar, multisequence MR imaging of the left hip was performed.
No intravenous contrast was administered.

[Series 4: T1 · coronal · B · 4.0mm · 0.95mm/px · 8 of 30 slices shown]
[im 1/30]
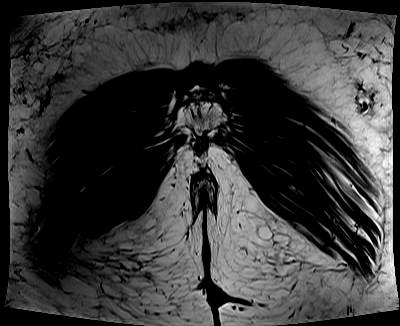
[im 5/30]
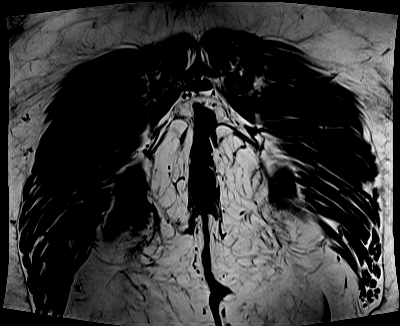
[im 9/30]
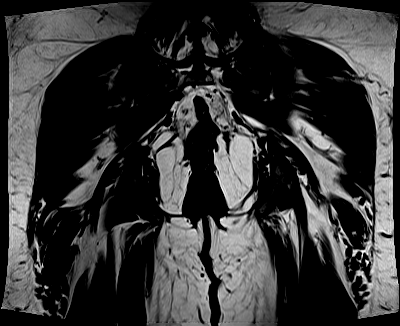
[im 13/30]
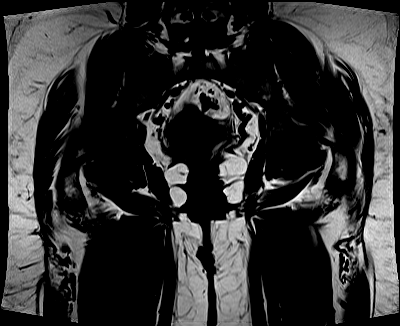
[im 17/30]
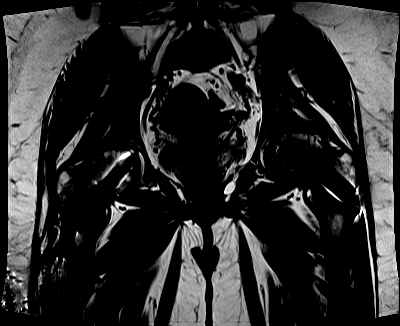
[im 21/30]
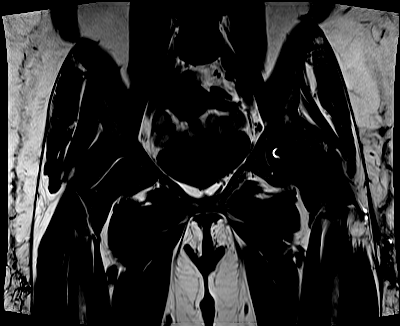
[im 25/30]
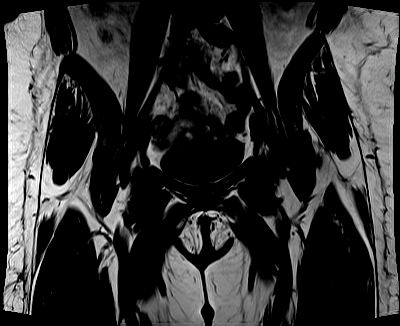
[im 30/30]
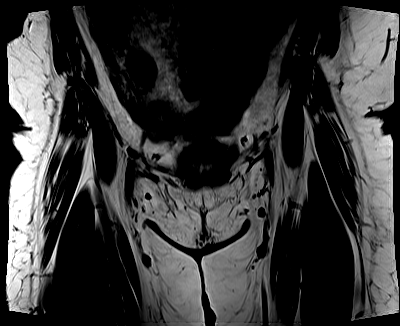

[Series 5: STIR · coronal · B · 4.0mm · 0.99mm/px · 9 of 30 slices shown]
[im 1/30]
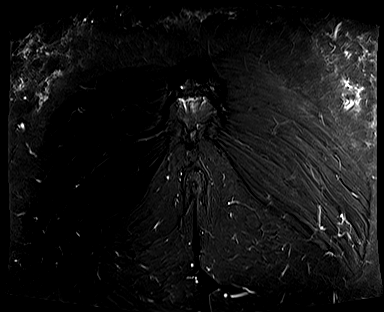
[im 4/30]
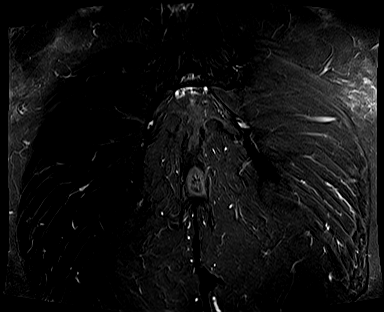
[im 8/30]
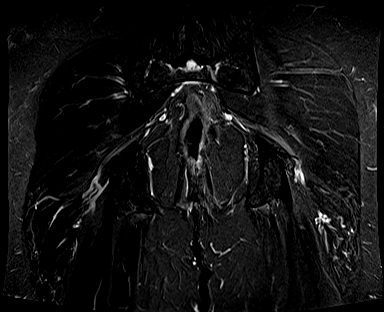
[im 11/30]
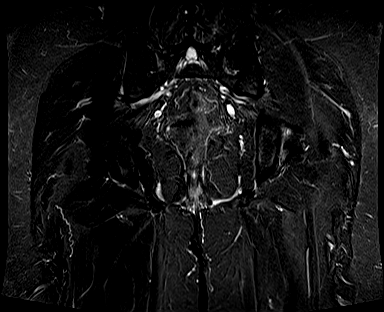
[im 15/30]
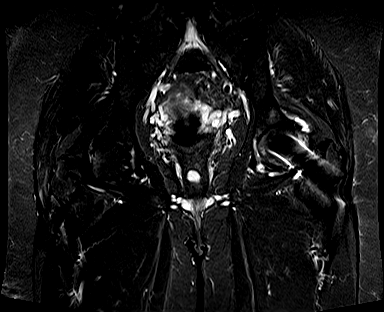
[im 19/30]
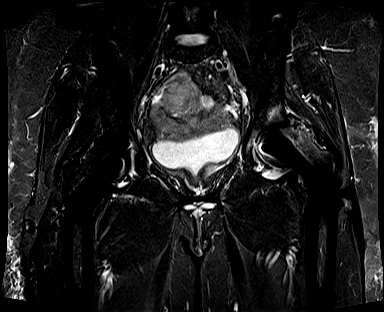
[im 22/30]
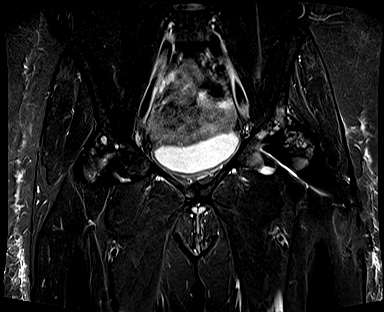
[im 26/30]
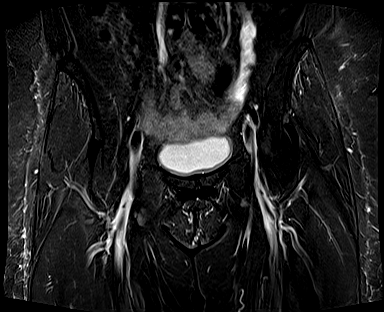
[im 30/30]
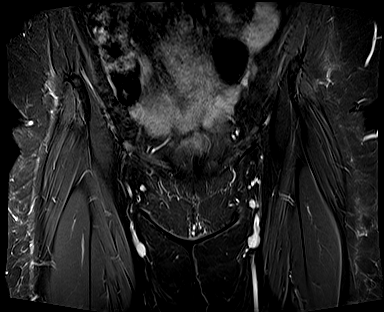

[Series 6: T2 fat-sat · axial · B · 4.0mm · 1.12mm/px · z∈[-85,+59]mm · 9 of 30 slices shown]
[im 1/30]
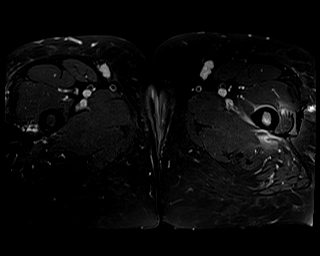
[im 4/30]
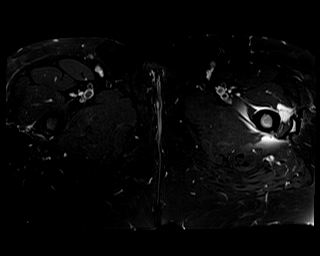
[im 8/30]
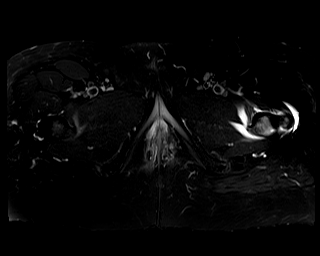
[im 11/30]
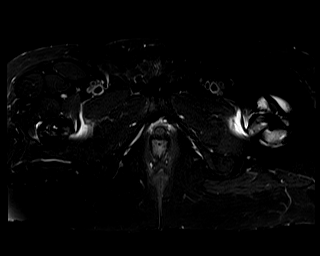
[im 15/30]
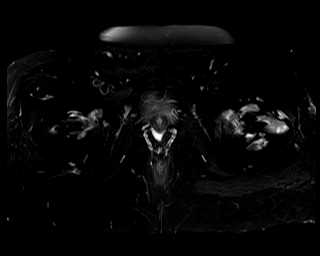
[im 19/30]
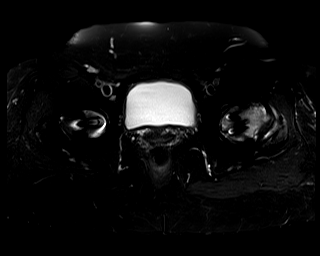
[im 22/30]
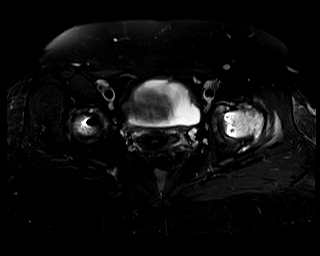
[im 26/30]
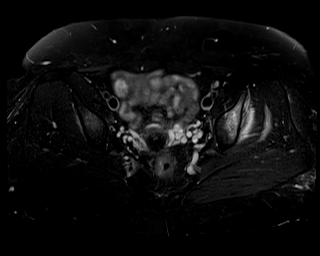
[im 30/30]
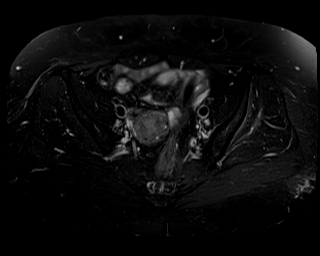

[Series 7: PD fat-sat · sagittal · B · 4.0mm · 0.70mm/px · 8 of 27 slices shown (1 of 2)]
[im 1/27]
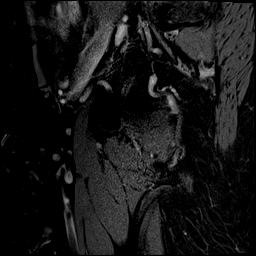
[im 4/27]
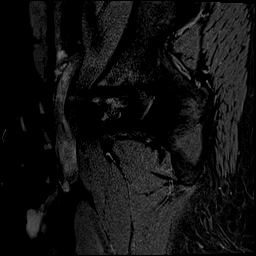
[im 8/27]
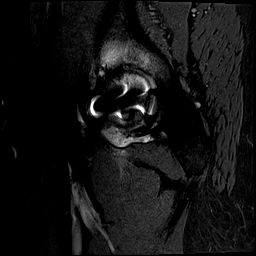
[im 12/27]
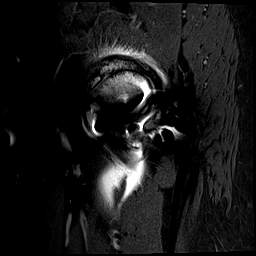
[im 15/27]
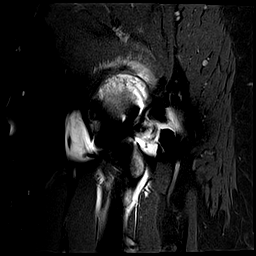
[im 19/27]
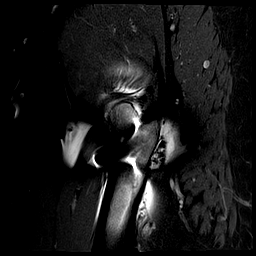
[im 23/27]
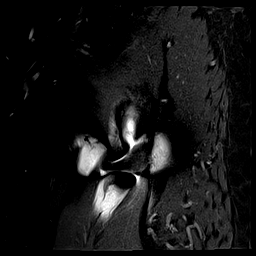
[im 27/27]
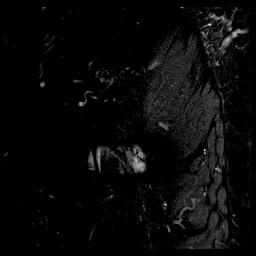

[Series 8: PD fat-sat · coronal · B · 4.0mm · 0.70mm/px · 6 of 20 slices shown (2 of 2)]
[im 1/20]
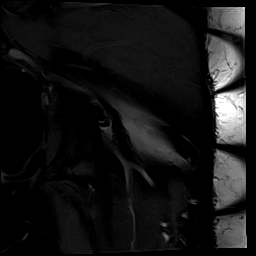
[im 4/20]
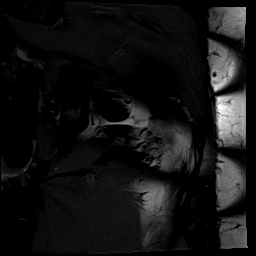
[im 8/20]
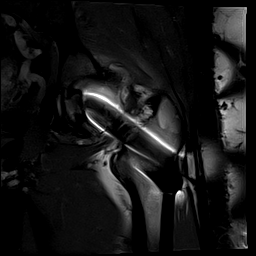
[im 12/20]
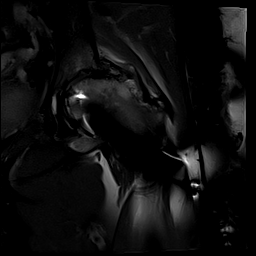
[im 16/20]
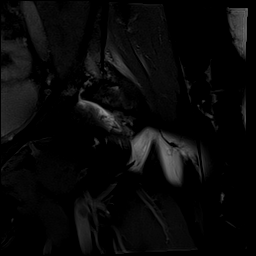
[im 20/20]
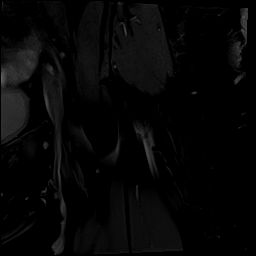

[40 of 40 positions shown; findings below may reference images not displayed]

FINDINGS: Bones:

Bilateral femoral neck screws without hardware failure or
complication. Prior right FVFG graft noted. No hip fracture,
dislocation or avascular necrosis.

No periosteal reaction or bone destruction. No aggressive osseous
lesion.

Normal sacrum and sacroiliac joints. No SI joint widening or erosive
changes.

Mild degenerative disease with disc height loss at L4-5.

Articular cartilage and labrum

Articular cartilage: High-grade partial-thickness cartilage loss
with areas of full-thickness cartilage loss of the left femoral head
and acetabulum with bulky marginal osteophytes and subchondral
reactive marrow edema. High-grade partial-thickness cartilage loss
with small areas of full-thickness cartilage loss of the right
femoral head and acetabulum.

Labrum: Maceration of the left labrum. Right superior labral
degeneration with a tear.

Joint or bursal effusion

Joint effusion: No SI joint effusion. Moderate left hip joint
effusion with synovitis. No significant right hip joint effusion.

Bursae:  No bursa formation.

Muscles and tendons

Flexors: Normal.

Extensors: Normal.

Abductors: Normal.

Adductors: Normal.

Gluteals: Normal.

Hamstrings: Normal.

Other findings

No pelvic free fluid. No fluid collection or hematoma. No inguinal
lymphadenopathy. No inguinal hernia.
IMPRESSION: 1. Severe osteoarthritis of the left hip. Moderate left hip joint
effusion with synovitis.
2. Moderate-severe osteoarthritis of the right hip.
3. No hip fracture, dislocation or avascular necrosis.

## 2022-03-26 DIAGNOSIS — I10 Essential (primary) hypertension: Secondary | ICD-10-CM | POA: Diagnosis not present

## 2022-03-26 DIAGNOSIS — L230 Allergic contact dermatitis due to metals: Secondary | ICD-10-CM | POA: Diagnosis not present

## 2022-04-12 ENCOUNTER — Ambulatory Visit (INDEPENDENT_AMBULATORY_CARE_PROVIDER_SITE_OTHER): Payer: 59 | Admitting: Diagnostic Neuroimaging

## 2022-04-12 ENCOUNTER — Encounter: Payer: Self-pay | Admitting: Diagnostic Neuroimaging

## 2022-04-12 VITALS — BP 118/79 | HR 84 | Ht 65.0 in | Wt 266.0 lb

## 2022-04-12 DIAGNOSIS — R4189 Other symptoms and signs involving cognitive functions and awareness: Secondary | ICD-10-CM | POA: Diagnosis not present

## 2022-04-12 DIAGNOSIS — G621 Alcoholic polyneuropathy: Secondary | ICD-10-CM | POA: Diagnosis not present

## 2022-04-12 NOTE — Patient Instructions (Signed)
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
## 2022-04-15 ENCOUNTER — Ambulatory Visit (INDEPENDENT_AMBULATORY_CARE_PROVIDER_SITE_OTHER): Payer: 59 | Admitting: *Deleted

## 2022-04-15 DIAGNOSIS — Z3042 Encounter for surveillance of injectable contraceptive: Secondary | ICD-10-CM | POA: Diagnosis not present

## 2022-04-15 MED ORDER — MEDROXYPROGESTERONE ACETATE 150 MG/ML IM SUSY
150.0000 mg | PREFILLED_SYRINGE | Freq: Once | INTRAMUSCULAR | Status: AC
  Administered 2022-04-15: 150 mg via INTRAMUSCULAR

## 2022-04-15 NOTE — Progress Notes (Signed)
   NURSE VISIT- INJECTION  SUBJECTIVE:  Melinda Giles is a 52 y.o. G18P1001 female here for a Depo Provera for contraception/period management. She is a GYN patient.   OBJECTIVE:  LMP  (LMP Unknown) Comment: > 2 years ago  Appears well, in no apparent distress  Injection administered in: Right deltoid  Meds ordered this encounter  Medications   medroxyPROGESTERone Acetate SUSY 150 mg    ASSESSMENT: GYN patient Depo Provera for contraception/period management PLAN: Follow-up: in 11-13 weeks for next Depo   Alice Rieger  04/15/2022 9:44 AM

## 2022-04-26 DIAGNOSIS — R569 Unspecified convulsions: Secondary | ICD-10-CM | POA: Diagnosis not present

## 2022-04-26 DIAGNOSIS — Z0001 Encounter for general adult medical examination with abnormal findings: Secondary | ICD-10-CM | POA: Diagnosis not present

## 2022-04-26 DIAGNOSIS — M87051 Idiopathic aseptic necrosis of right femur: Secondary | ICD-10-CM | POA: Diagnosis not present

## 2022-04-26 DIAGNOSIS — G629 Polyneuropathy, unspecified: Secondary | ICD-10-CM | POA: Diagnosis not present

## 2022-04-26 DIAGNOSIS — Z1389 Encounter for screening for other disorder: Secondary | ICD-10-CM | POA: Diagnosis not present

## 2022-04-26 DIAGNOSIS — I10 Essential (primary) hypertension: Secondary | ICD-10-CM | POA: Diagnosis not present

## 2022-04-28 DIAGNOSIS — I10 Essential (primary) hypertension: Secondary | ICD-10-CM | POA: Diagnosis not present

## 2022-04-28 DIAGNOSIS — Z833 Family history of diabetes mellitus: Secondary | ICD-10-CM | POA: Diagnosis not present

## 2022-04-28 DIAGNOSIS — Z0001 Encounter for general adult medical examination with abnormal findings: Secondary | ICD-10-CM | POA: Diagnosis not present

## 2022-04-28 DIAGNOSIS — R739 Hyperglycemia, unspecified: Secondary | ICD-10-CM | POA: Diagnosis not present

## 2022-05-04 DIAGNOSIS — E1165 Type 2 diabetes mellitus with hyperglycemia: Secondary | ICD-10-CM | POA: Diagnosis not present

## 2022-05-04 DIAGNOSIS — I10 Essential (primary) hypertension: Secondary | ICD-10-CM | POA: Diagnosis not present

## 2022-05-24 ENCOUNTER — Other Ambulatory Visit: Payer: Self-pay | Admitting: Diagnostic Neuroimaging

## 2022-05-24 MED ORDER — PREGABALIN 150 MG PO CAPS: 150.0000 mg | ORAL_CAPSULE | Freq: Two times a day (BID) | ORAL | 5 refills | Status: DC

## 2022-05-24 NOTE — Telephone Encounter (Signed)
Pt aunt called. Stated she talk to pt PCP about writing prescription pregabalin (LYRICA) 150 MG capsule. Stated he will not write prescription for pt. Stated it needs to come from Dr. Marjory Lies. She is asking will Dr. Marjory Lies write prescription for pt. Please send refill to  [facility] [phone]

## 2022-06-01 ENCOUNTER — Encounter: Payer: Self-pay | Admitting: *Deleted

## 2022-06-01 ENCOUNTER — Encounter: Payer: Self-pay | Admitting: Internal Medicine

## 2022-06-01 ENCOUNTER — Telehealth: Payer: Self-pay | Admitting: *Deleted

## 2022-06-01 ENCOUNTER — Ambulatory Visit (INDEPENDENT_AMBULATORY_CARE_PROVIDER_SITE_OTHER): Payer: 59 | Admitting: Internal Medicine

## 2022-06-01 VITALS — BP 104/72 | HR 94 | Ht 65.0 in | Wt 263.4 lb

## 2022-06-01 DIAGNOSIS — E1169 Type 2 diabetes mellitus with other specified complication: Secondary | ICD-10-CM | POA: Diagnosis not present

## 2022-06-01 DIAGNOSIS — Z1211 Encounter for screening for malignant neoplasm of colon: Secondary | ICD-10-CM

## 2022-06-01 DIAGNOSIS — Z7984 Long term (current) use of oral hypoglycemic drugs: Secondary | ICD-10-CM | POA: Diagnosis not present

## 2022-06-01 DIAGNOSIS — Z122 Encounter for screening for malignant neoplasm of respiratory organs: Secondary | ICD-10-CM

## 2022-06-01 DIAGNOSIS — I10 Essential (primary) hypertension: Secondary | ICD-10-CM

## 2022-06-01 DIAGNOSIS — Z862 Personal history of diseases of the blood and blood-forming organs and certain disorders involving the immune mechanism: Secondary | ICD-10-CM

## 2022-06-01 DIAGNOSIS — E785 Hyperlipidemia, unspecified: Secondary | ICD-10-CM

## 2022-06-01 DIAGNOSIS — E559 Vitamin D deficiency, unspecified: Secondary | ICD-10-CM | POA: Diagnosis not present

## 2022-06-01 DIAGNOSIS — Z1329 Encounter for screening for other suspected endocrine disorder: Secondary | ICD-10-CM | POA: Diagnosis not present

## 2022-06-01 DIAGNOSIS — M160 Bilateral primary osteoarthritis of hip: Secondary | ICD-10-CM | POA: Insufficient documentation

## 2022-06-01 DIAGNOSIS — E119 Type 2 diabetes mellitus without complications: Secondary | ICD-10-CM | POA: Insufficient documentation

## 2022-06-01 DIAGNOSIS — K7460 Unspecified cirrhosis of liver: Secondary | ICD-10-CM

## 2022-06-01 DIAGNOSIS — Z114 Encounter for screening for human immunodeficiency virus [HIV]: Secondary | ICD-10-CM

## 2022-06-01 DIAGNOSIS — Z87891 Personal history of nicotine dependence: Secondary | ICD-10-CM | POA: Diagnosis not present

## 2022-06-01 DIAGNOSIS — G621 Alcoholic polyneuropathy: Secondary | ICD-10-CM

## 2022-06-01 DIAGNOSIS — D649 Anemia, unspecified: Secondary | ICD-10-CM

## 2022-06-01 DIAGNOSIS — R4189 Other symptoms and signs involving cognitive functions and awareness: Secondary | ICD-10-CM

## 2022-06-01 NOTE — Assessment & Plan Note (Signed)
Previously documented history of vitamin D deficiency.  Currently taking daily vitamin D supplementation. -Repeat vitamin D level ordered today

## 2022-06-01 NOTE — Assessment & Plan Note (Signed)
Recently evaluated by neurology.  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

## 2022-06-01 NOTE — Assessment & Plan Note (Signed)
Previously documented history of cirrhosis.  Appears compensated today.  Etiology unclear, though she does have a past medical history significant for alcohol abuse. -Repeat labs ordered today

## 2022-06-01 NOTE — Assessment & Plan Note (Signed)
She is currently prescribed atorvastatin 10 mg daily. -Repeat lipid panel ordered today

## 2022-06-01 NOTE — Assessment & Plan Note (Signed)
Previously documented history of unspecified anemia. -Repeat CBC and iron studies ordered today

## 2022-06-01 NOTE — Assessment & Plan Note (Signed)
Previously documented history of essential hypertension.  She is currently prescribed metoprolol tartrate 50 mg twice daily.  BP today is 104/72. -No medication changes today.  Repeat labs ordered.  Consider addition of ACE/ARB therapy at follow-up in the setting of diabetes mellitus.
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
## 2022-06-01 NOTE — Assessment & Plan Note (Addendum)
Gastroenterology referral placed for screening colonoscopy

## 2022-06-01 NOTE — Patient Instructions (Signed)
It was a pleasure to see you today.  Thank you for giving [place] the opportunity to be involved in your care.  Below is a brief recap of your visit and next steps.  We will plan to see you again in 4 weeks.  Summary You have established care today. We will check labs You have been referred to gastroenterology for colonoscopy, lung cancer screening, and a dietician Follow up in 4 weeks to review labs and make appropriate medication adjustments

## 2022-06-01 NOTE — Assessment & Plan Note (Signed)
Recent diagnosis with A1c 7.0.  She is currently prescribed metformin 500 mg twice daily. -Repeat A1c ordered today -Urine microalbumin/creatinine ratio ordered today -Patient's aunt has requested a referral to nutrition to discuss necessary dietary changes in the setting of diabetes.  This referral was placed today. -Follow-up in 4 weeks to review labs and make necessary medication adjustments.

## 2022-06-01 NOTE — Assessment & Plan Note (Signed)
Previously smoked 1 pack/day of cigarettes x 30 years prior to quitting in 2021.  Meets criteria for lung cancer screening. -Patient in agreement with referral for lung cancer screening today.

## 2022-06-01 NOTE — Assessment & Plan Note (Signed)
History of bilateral hip OA, further complicated by a history of osteonecrosis of both hips.  Has pins in place.  She has previously been evaluated by orthopedic surgery and has discussed arthroplasty.  She has declined surgery for now, but will notify us when she would like to reestablish care with orthopedic surgery.

## 2022-06-01 NOTE — Telephone Encounter (Signed)
Called and left patient voicemail to call office to get depo rescheduled due to a scheduling conflict.

## 2022-06-01 NOTE — Assessment & Plan Note (Signed)
Recently evaluated by neurology.  She is currently prescribed Lyrica 150 mg twice daily.

## 2022-06-02 LAB — MICROALBUMIN / CREATININE URINE RATIO

## 2022-06-02 LAB — CBC WITH DIFFERENTIAL/PLATELET
EOS (ABSOLUTE): 0.2 10*3/uL (ref 0.0–0.4)
Hematocrit: 40.7 % (ref 34.0–46.6)
Immature Grans (Abs): 0.1 10*3/uL (ref 0.0–0.1)
Immature Granulocytes: 1 %
Lymphs: 24 %
MCHC: 34.2 g/dL (ref 31.5–35.7)

## 2022-06-02 LAB — CMP14+EGFR
Albumin/Globulin Ratio: 1.1 — ABNORMAL LOW (ref 1.2–2.2)
Albumin: 4 g/dL (ref 3.8–4.9)
Bilirubin Total: 0.9 mg/dL (ref 0.0–1.2)
Creatinine, Ser: 0.83 mg/dL (ref 0.57–1.00)
Total Protein: 7.5 g/dL (ref 6.0–8.5)

## 2022-06-02 LAB — VITAMIN D 25 HYDROXY (VIT D DEFICIENCY, FRACTURES): Vit D, 25-Hydroxy: 92.8 ng/mL (ref 30.0–100.0)

## 2022-06-02 LAB — TSH+FREE T4
Free T4: 1.25 ng/dL (ref 0.82–1.77)
TSH: 2.44 u[IU]/mL (ref 0.450–4.500)

## 2022-06-02 LAB — LIPID PANEL: Chol/HDL Ratio: 1.7 ratio (ref 0.0–4.4)

## 2022-06-02 LAB — IRON,TIBC AND FERRITIN PANEL: Ferritin: 366 ng/mL — ABNORMAL HIGH (ref 15–150)

## 2022-06-03 LAB — IRON,TIBC AND FERRITIN PANEL
Iron Saturation: 29 % (ref 15–55)
Iron: 86 ug/dL (ref 27–159)
Total Iron Binding Capacity: 294 ug/dL (ref 250–450)
UIBC: 208 ug/dL (ref 131–425)

## 2022-06-03 LAB — CBC WITH DIFFERENTIAL/PLATELET
Basophils Absolute: 0.1 10*3/uL (ref 0.0–0.2)
Basos: 1 %
Eos: 2 %
Hemoglobin: 13.9 g/dL (ref 11.1–15.9)
Lymphocytes Absolute: 2.8 10*3/uL (ref 0.7–3.1)
MCH: 30.4 pg (ref 26.6–33.0)
MCV: 89 fL (ref 79–97)
Monocytes Absolute: 0.9 10*3/uL (ref 0.1–0.9)
Monocytes: 8 %
Neutrophils Absolute: 7.6 10*3/uL — ABNORMAL HIGH (ref 1.4–7.0)
Neutrophils: 64 %
Platelets: 313 10*3/uL (ref 150–450)
RBC: 4.57 x10E6/uL (ref 3.77–5.28)
RDW: 11.9 % (ref 11.7–15.4)
WBC: 11.6 10*3/uL — ABNORMAL HIGH (ref 3.4–10.8)

## 2022-06-03 LAB — HEMOGLOBIN A1C
Est. average glucose Bld gHb Est-mCnc: 160 mg/dL
Hgb A1c MFr Bld: 7.2 % — ABNORMAL HIGH (ref 4.8–5.6)

## 2022-06-03 LAB — LIPID PANEL
Cholesterol, Total: 61 mg/dL — ABNORMAL LOW (ref 100–199)
HDL: 36 mg/dL — ABNORMAL LOW (ref >39)
LDL Chol Calc (NIH): 12 mg/dL (ref 0–99)
Triglycerides: 48 mg/dL (ref 0–149)
VLDL Cholesterol Cal: 13 mg/dL (ref 5–40)

## 2022-06-03 LAB — CMP14+EGFR
ALT: 17 IU/L (ref 0–32)
AST: 17 IU/L (ref 0–40)
Alkaline Phosphatase: 59 IU/L (ref 44–121)
BUN/Creatinine Ratio: 11 (ref 9–23)
BUN: 9 mg/dL (ref 6–24)
CO2: 19 mmol/L — ABNORMAL LOW (ref 20–29)
Calcium: 9.6 mg/dL (ref 8.7–10.2)
Chloride: 106 mmol/L (ref 96–106)
Globulin, Total: 3.5 g/dL (ref 1.5–4.5)
Glucose: 108 mg/dL — ABNORMAL HIGH (ref 70–99)
Potassium: 4.4 mmol/L (ref 3.5–5.2)
Sodium: 141 mmol/L (ref 134–144)
eGFR: 85 mL/min/{1.73_m2} (ref >59)

## 2022-06-03 LAB — MICROALBUMIN / CREATININE URINE RATIO: Creatinine, Urine: 108.6 mg/dL

## 2022-06-03 LAB — PROTIME-INR
INR: 1.1 (ref 0.9–1.2)
Prothrombin Time: 12.3 s — ABNORMAL HIGH (ref 9.1–12.0)

## 2022-06-03 LAB — B12 AND FOLATE PANEL
Folate: 9.7 ng/mL (ref >3.0)
Vitamin B-12: 566 pg/mL (ref 232–1245)

## 2022-06-03 LAB — HIV ANTIBODY (ROUTINE TESTING W REFLEX): HIV Screen 4th Generation wRfx: NONREACTIVE

## 2022-06-28 ENCOUNTER — Telehealth: Payer: Self-pay | Admitting: Internal Medicine

## 2022-06-28 ENCOUNTER — Other Ambulatory Visit: Payer: Self-pay

## 2022-06-28 MED ORDER — METOPROLOL TARTRATE 50 MG PO TABS: 50.0000 mg | ORAL_TABLET | Freq: Two times a day (BID) | ORAL | 1 refills | Status: DC

## 2022-06-28 NOTE — Telephone Encounter (Signed)
Prescription Request  06/28/2022  LOV: 06/01/2022  What is the name of the medication or equipment? metoprolol tartrate (LOPRESSOR) 50 MG tablet   Have you contacted your pharmacy to request a refill? Yes   Which pharmacy would you like this sent to?  [facility] [address] [place] - [address] [place] [address] Phone: [phone] Fax: [phone]    Patient notified that their request is being sent to the clinical staff for review and that they should receive a response within 2 business days.   Please advise at Mobile [phone] (mobile)

## 2022-06-28 NOTE — Telephone Encounter (Signed)
Refill sent.

## 2022-07-01 ENCOUNTER — Encounter: Payer: Self-pay | Admitting: Internal Medicine

## 2022-07-01 ENCOUNTER — Telehealth: Payer: Self-pay | Admitting: *Deleted

## 2022-07-01 ENCOUNTER — Telehealth: Payer: Self-pay | Admitting: Internal Medicine

## 2022-07-01 ENCOUNTER — Ambulatory Visit (INDEPENDENT_AMBULATORY_CARE_PROVIDER_SITE_OTHER): Payer: 59 | Admitting: Internal Medicine

## 2022-07-01 VITALS — BP 121/80 | HR 91 | Ht 66.0 in | Wt 263.6 lb

## 2022-07-01 DIAGNOSIS — E1165 Type 2 diabetes mellitus with hyperglycemia: Secondary | ICD-10-CM

## 2022-07-01 DIAGNOSIS — I10 Essential (primary) hypertension: Secondary | ICD-10-CM | POA: Diagnosis not present

## 2022-07-01 DIAGNOSIS — Z1211 Encounter for screening for malignant neoplasm of colon: Secondary | ICD-10-CM | POA: Diagnosis not present

## 2022-07-01 MED ORDER — LOSARTAN POTASSIUM 25 MG PO TABS
25.0000 mg | ORAL_TABLET | Freq: Every day | ORAL | 1 refills | Status: DC
Start: 2022-07-01 — End: 2022-10-25

## 2022-07-01 MED ORDER — METOPROLOL SUCCINATE ER 25 MG PO TB24
25.0000 mg | ORAL_TABLET | Freq: Every day | ORAL | 3 refills | Status: DC
Start: 2022-07-01 — End: 2023-06-06

## 2022-07-01 NOTE — Telephone Encounter (Signed)
Received questionnaire and sent to provider for review.  
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
## 2022-07-01 NOTE — Patient Instructions (Addendum)
It was a pleasure to see you today.  Thank you for giving [place] the opportunity to be involved in your care.  Below is a brief recap of your visit and next steps.  We will plan to see you again in 3 months.  Summary Increase metformin to 1000 mg twice daily Add losartan 25 mg daily STOP metoprolol tartrate 50 mg twice daily START metoprolol succinate 25 mg daily Eye doctor referral placed GI doctor referral placed for colonoscopy Follow up in 3 months

## 2022-07-01 NOTE — Telephone Encounter (Signed)
  Procedure: colonoscopy  Height: 5'6" Weight: 263 lbs      BMI: 42.4  Have you had a colonoscopy before?  No  Do you have family history of colon cancer?  No  Do you have a family history of polyps? yes  Previous colonoscopy with polyps removed? No  Do you have a history colorectal cancer? No  Are you diabetic?  Type 2  Do you have a prosthetic or mechanical heart valve? No  Do you have a pacemaker/defibrillator?   No  Have you had endocarditis/atrial fibrillation?  No  Do you use supplemental oxygen/CPAP?  No  Have you had joint replacement within the last 12 months?  No  Do you tend to be constipated or have to use laxatives?  No   Do you have history of alcohol use? If yes, how much and how often.  Yes but quit  Do you have history or are you using drugs? If yes, what do are you  using?  No  Have you ever had a stroke/heart attack?  No  Have you ever had a heart or other vascular stent placed,?no  Do you take weight loss medication? No  female patients,: have you had a hysterectomy? yes                              are you post menopausal?  No                              do you still have your menstrual cycle? No takes depo injection    Date of last menstrual period? unknown  Do you take any blood-thinning medications such as: (Plavix, aspirin, Coumadin, Aggrenox, Brilinta, Xarelto, Eliquis, Pradaxa, Savaysa or Effient)? No  If yes we need the name, milligram, dosage and who is prescribing doctor:  n/a             Current Outpatient Medications  Medication Sig Dispense Refill   Apoaequorin (PREVAGEN PO) Take 1 capsule by mouth daily.     atorvastatin (LIPITOR) 10 MG tablet Take 10 mg by mouth daily.     Cholecalciferol (VITAMIN D-3) 25 MCG (1000 UT) CAPS Take by mouth.     ibuprofen (ADVIL) 800 MG tablet Take 1 tablet (800 mg total) by mouth every 8 (eight) hours as needed. 30 tablet 2   losartan (COZAAR) 25 MG tablet Take 1 tablet (25 mg total) by mouth  daily. 90 tablet 1   medroxyPROGESTERone (DEPO-PROVERA) 150 MG/ML injection Inject 1 mL (150 mg total) into the muscle every 3 (three) months. 1 mL 3   metFORMIN (GLUCOPHAGE) 500 MG tablet Take 1,000 mg by mouth 2 (two) times daily with a meal.     metoprolol succinate (TOPROL-XL) 25 MG 24 hr tablet Take 1 tablet (25 mg total) by mouth daily. 90 tablet 3   pregabalin (LYRICA) 150 MG capsule Take 1 capsule (150 mg total) by mouth 2 (two) times daily. 60 capsule 5   No current facility-administered medications for this visit.    Allergies  Allergen Reactions   Lisinopril Anaphylaxis   Other Anaphylaxis and Swelling    "pril " family of bp meds

## 2022-07-06 ENCOUNTER — Telehealth: Payer: Self-pay

## 2022-07-06 ENCOUNTER — Other Ambulatory Visit: Payer: Self-pay | Admitting: *Deleted

## 2022-07-06 MED ORDER — MEDROXYPROGESTERONE ACETATE 150 MG/ML IM SUSP: 150.0000 mg | INTRAMUSCULAR | 3 refills | Status: DC

## 2022-07-06 NOTE — Telephone Encounter (Signed)
Refill request to Dr. Eure. ?

## 2022-07-06 NOTE — Telephone Encounter (Signed)
Patient has an appointment on Friday.  Can you please send in a refill for her Depo to [facility] in [place]

## 2022-07-08 ENCOUNTER — Ambulatory Visit: Payer: 59

## 2022-07-08 NOTE — Assessment & Plan Note (Signed)
A1c 7.2 on labs from last month.  She is currently prescribed metformin 500 mg twice daily.  Urine microalbumin/creatinine ratio was within normal limits. -Increase metformin 1000 mg twice daily -Start ARB.  Currently on atorvastatin. -Diabetic foot exam completed today -Ophthalmology referral placed for diabetic eye exam

## 2022-07-08 NOTE — Assessment & Plan Note (Signed)
BP remains adequately controlled on metoprolol tartrate 50 mg twice daily. -Discontinue metoprolol to tartrate in favor of metoprolol succinate 25 mg daily -Start losartan 25 mg daily for renal protection in the setting of diabetes mellitus

## 2022-07-09 ENCOUNTER — Ambulatory Visit (INDEPENDENT_AMBULATORY_CARE_PROVIDER_SITE_OTHER): Payer: 59 | Admitting: *Deleted

## 2022-07-09 DIAGNOSIS — Z3042 Encounter for surveillance of injectable contraceptive: Secondary | ICD-10-CM | POA: Diagnosis not present

## 2022-07-09 MED ORDER — MEDROXYPROGESTERONE ACETATE 150 MG/ML IM SUSY
150.0000 mg | PREFILLED_SYRINGE | Freq: Once | INTRAMUSCULAR | Status: AC
Start: 2022-07-09 — End: 2022-07-09
  Administered 2022-07-09: 150 mg via INTRAMUSCULAR

## 2022-07-09 NOTE — Progress Notes (Signed)
   NURSE VISIT- INJECTION  SUBJECTIVE:  Melinda Giles is a 52 y.o. [id number] female here for a Depo Provera for contraception/period management. She is a GYN patient.   OBJECTIVE:  LMP  (LMP Unknown) Comment: > 2 years ago  Appears well, in no apparent distress  Injection administered in: Right deltoid  Meds ordered this encounter  Medications   medroxyPROGESTERone Acetate SUSY 150 mg    ASSESSMENT: GYN patient Depo Provera for contraception/period management PLAN: Follow-up: in 11-13 weeks for next Depo   Jobe Marker  07/09/2022 10:42 AM

## 2022-07-21 ENCOUNTER — Telehealth: Payer: Self-pay | Admitting: Internal Medicine

## 2022-07-21 MED ORDER — METFORMIN HCL 500 MG PO TABS: 1000.0000 mg | ORAL_TABLET | Freq: Two times a day (BID) | ORAL | 2 refills | Status: DC

## 2022-07-21 NOTE — Telephone Encounter (Signed)
LVM

## 2022-07-21 NOTE — Telephone Encounter (Signed)
Patient came by office need refill and asked was provider going to change the dosage as discuss in visit.   Metformin   Pharmacy [facility]

## 2022-07-23 NOTE — Telephone Encounter (Signed)
Patient aware.

## 2022-07-26 ENCOUNTER — Telehealth: Payer: Self-pay | Admitting: Internal Medicine

## 2022-07-26 ENCOUNTER — Other Ambulatory Visit: Payer: Self-pay

## 2022-07-26 MED ORDER — ATORVASTATIN CALCIUM 10 MG PO TABS: 10.0000 mg | ORAL_TABLET | Freq: Every day | ORAL | 0 refills | Status: DC

## 2022-07-26 NOTE — Telephone Encounter (Signed)
Refills sent to pharmacy. 

## 2022-07-26 NOTE — Telephone Encounter (Signed)
Med refill  Atorvastatin 10 mg   Pharmacy [facility] [place]

## 2022-08-10 ENCOUNTER — Other Ambulatory Visit: Payer: Self-pay

## 2022-08-10 ENCOUNTER — Telehealth: Payer: Self-pay | Admitting: Internal Medicine

## 2022-08-10 ENCOUNTER — Other Ambulatory Visit: Payer: Self-pay | Admitting: Internal Medicine

## 2022-08-10 ENCOUNTER — Encounter: Payer: Self-pay | Admitting: Nutrition

## 2022-08-10 ENCOUNTER — Encounter: Payer: 59 | Attending: Internal Medicine | Admitting: Nutrition

## 2022-08-10 VITALS — Ht 65.0 in | Wt 266.0 lb

## 2022-08-10 DIAGNOSIS — E785 Hyperlipidemia, unspecified: Secondary | ICD-10-CM | POA: Diagnosis not present

## 2022-08-10 DIAGNOSIS — E1169 Type 2 diabetes mellitus with other specified complication: Secondary | ICD-10-CM | POA: Insufficient documentation

## 2022-08-10 DIAGNOSIS — E118 Type 2 diabetes mellitus with unspecified complications: Secondary | ICD-10-CM | POA: Insufficient documentation

## 2022-08-10 DIAGNOSIS — I10 Essential (primary) hypertension: Secondary | ICD-10-CM | POA: Diagnosis not present

## 2022-08-10 DIAGNOSIS — E119 Type 2 diabetes mellitus without complications: Secondary | ICD-10-CM

## 2022-08-10 MED ORDER — BLOOD GLUCOSE MONITORING SUPPL DEVI: 1.0000 | Freq: Three times a day (TID) | 0 refills | Status: DC

## 2022-08-10 MED ORDER — ADULT BLOOD PRESSURE CUFF LG KIT: PACK | 0 refills | Status: DC

## 2022-08-10 MED ORDER — LANCET DEVICE MISC: 1.0000 | Freq: Three times a day (TID) | 0 refills | Status: AC

## 2022-08-10 MED ORDER — LANCETS MISC. MISC: 1.0000 | Freq: Three times a day (TID) | 0 refills | Status: AC

## 2022-08-10 MED ORDER — BLOOD GLUCOSE TEST VI STRP: 1.0000 | ORAL_STRIP | Freq: Three times a day (TID) | 0 refills | Status: AC

## 2022-08-10 NOTE — Telephone Encounter (Signed)
Patient aunt Melinda Giles came by office needs diabetic meter and supplies sent to [employer]
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
## 2022-08-10 NOTE — Telephone Encounter (Signed)
Sent to [facility].

## 2022-08-10 NOTE — Patient Instructions (Signed)
Goals  Cut out snacking between meals Cut out junk food. Increase fruits, vegetables and whole grains with meals daily. Lose 1 lb per week Call PCP to get a meter to test blood sugars

## 2022-08-16 NOTE — Telephone Encounter (Signed)
ASA 3. Cirrhosis, obesity. Recommend OV prior to TCS. Was supposed to have done 2022 but rescheduled and did not complete

## 2022-08-25 ENCOUNTER — Other Ambulatory Visit: Payer: Self-pay

## 2022-08-25 ENCOUNTER — Telehealth: Payer: Self-pay | Admitting: Internal Medicine

## 2022-08-25 DIAGNOSIS — E1165 Type 2 diabetes mellitus with hyperglycemia: Secondary | ICD-10-CM

## 2022-08-25 MED ORDER — ADULT BLOOD PRESSURE CUFF LG KIT: PACK | 0 refills | Status: DC

## 2022-08-25 NOTE — Telephone Encounter (Signed)
Needs Blood Pressure Monitoring (ADULT BLOOD PRESSURE CUFF LG) KIT  Sent to [facility]. [facility] does not have in stock

## 2022-08-25 NOTE — Telephone Encounter (Signed)
Sent to [facility] 

## 2022-08-25 NOTE — Telephone Encounter (Signed)
OV scheduled for 8/20 at 1130 with LSL

## 2022-08-26 NOTE — Telephone Encounter (Signed)
Noted  

## 2022-08-30 MED ORDER — FREESTYLE LIBRE 3 READER DEVI
1.0000 | 0 refills | Status: DC
Start: 2022-08-30 — End: 2023-05-25

## 2022-08-30 MED ORDER — FREESTYLE LIBRE 3 SENSOR MISC
1.0000 | 3 refills | Status: DC
Start: 2022-08-30 — End: 2023-05-25

## 2022-08-30 NOTE — Telephone Encounter (Signed)
Patient requesting the glucose monitor that she can  wear on her arm. Please advise Thank you

## 2022-08-30 NOTE — Addendum Note (Signed)
Addended by: Christel Mormon E on: 08/30/2022 03:40 PM   Modules accepted: Orders
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
## 2022-09-01 ENCOUNTER — Telehealth: Payer: Self-pay | Admitting: Gastroenterology

## 2022-09-01 ENCOUNTER — Other Ambulatory Visit: Payer: Self-pay | Admitting: *Deleted

## 2022-09-01 ENCOUNTER — Encounter: Payer: Self-pay | Admitting: Gastroenterology

## 2022-09-01 ENCOUNTER — Encounter: Payer: Self-pay | Admitting: *Deleted

## 2022-09-01 ENCOUNTER — Ambulatory Visit (INDEPENDENT_AMBULATORY_CARE_PROVIDER_SITE_OTHER): Payer: 59 | Admitting: Gastroenterology

## 2022-09-01 VITALS — BP 106/61 | HR 89 | Temp 97.7°F | Ht 65.0 in | Wt 264.8 lb

## 2022-09-01 DIAGNOSIS — Z1211 Encounter for screening for malignant neoplasm of colon: Secondary | ICD-10-CM | POA: Diagnosis not present

## 2022-09-01 DIAGNOSIS — R413 Other amnesia: Secondary | ICD-10-CM | POA: Diagnosis not present

## 2022-09-01 DIAGNOSIS — K7030 Alcoholic cirrhosis of liver without ascites: Secondary | ICD-10-CM

## 2022-09-01 MED ORDER — NA SULFATE-K SULFATE-MG SULF 17.5-3.13-1.6 GM/177ML PO SOLN: ORAL | 0 refills | Status: DC

## 2022-09-01 NOTE — Telephone Encounter (Signed)
Aunt Delois came to office. Gave information about [place]. Colonoscopy has been scheduled for 10/04/22. Instructions printed, gave to aunt and prep sent to the pharmacy.

## 2022-09-01 NOTE — Patient Instructions (Addendum)
We will arrange to have a colonoscopy in the near future with Dr. Marletta Lor.  Please have blood work completed at [facility].  If your ferritin remains elevated, we will place a referral to hematology to follow-up on this.  Continue to avoid alcohol.  We will see back in the office in 6 months or sooner if needed.  Those very nice to meet you today!

## 2022-09-01 NOTE — Telephone Encounter (Signed)
Will call aunt with pre-op appointment

## 2022-09-01 NOTE — Telephone Encounter (Signed)
Called pt's aunt Delois (on [place]) to give appt for [place]. She states she isn't home and will call back once she gets home

## 2022-09-01 NOTE — Telephone Encounter (Signed)
[place] is scheduled for 09/06/22, arrive at 10:15 am to check in, NPO 6-8 hours prior.

## 2022-09-01 NOTE — Telephone Encounter (Signed)
Please arrange colonoscopy with propofol with Dr. Marletta Lor for colon cancer screening.  ASA 3 UTP prior Will need to give low volume prep if able to help with compliance.  Please also arrange RUQ [place] for cirrhosis. Orders have been placed.

## 2022-09-02 ENCOUNTER — Other Ambulatory Visit: Payer: Self-pay | Admitting: *Deleted

## 2022-09-02 ENCOUNTER — Ambulatory Visit (HOSPITAL_COMMUNITY): Payer: 59

## 2022-09-02 DIAGNOSIS — R7989 Other specified abnormal findings of blood chemistry: Secondary | ICD-10-CM

## 2022-09-02 DIAGNOSIS — K7469 Other cirrhosis of liver: Secondary | ICD-10-CM

## 2022-09-02 LAB — AMMONIA: Ammonia: 34 ug/dL (ref 34–178)

## 2022-09-02 LAB — IGG, IGA, IGM
IgA/Immunoglobulin A, Serum: 532 mg/dL — ABNORMAL HIGH (ref 87–352)
IgG (Immunoglobin G), Serum: 1834 mg/dL — ABNORMAL HIGH (ref 586–1602)
IgM (Immunoglobulin M), Srm: 100 mg/dL (ref 26–217)

## 2022-09-02 LAB — AFP TUMOR MARKER: AFP, Serum, Tumor Marker: 4.4 ng/mL (ref 0.0–9.2)

## 2022-09-02 LAB — FERRITIN: Ferritin: 404 ng/mL — ABNORMAL HIGH (ref 15–150)

## 2022-09-02 NOTE — Telephone Encounter (Signed)
[facility]   Pre-op on Wednesday 09/29/22 at 8:00 am

## 2022-09-02 NOTE — Progress Notes (Signed)
error 

## 2022-09-03 DIAGNOSIS — K7469 Other cirrhosis of liver: Secondary | ICD-10-CM | POA: Diagnosis not present

## 2022-09-04 LAB — ANA: Anti Nuclear Antibody (ANA): NEGATIVE

## 2022-09-06 ENCOUNTER — Ambulatory Visit (HOSPITAL_COMMUNITY)
Admission: RE | Admit: 2022-09-06 | Discharge: 2022-09-06 | Disposition: A | Discharge status: Home / Self Care | Payer: 59 | Source: Ambulatory Visit | Attending: Gastroenterology | Admitting: Gastroenterology

## 2022-09-06 DIAGNOSIS — K7689 Other specified diseases of liver: Secondary | ICD-10-CM | POA: Diagnosis not present

## 2022-09-06 DIAGNOSIS — K7460 Unspecified cirrhosis of liver: Secondary | ICD-10-CM | POA: Diagnosis not present

## 2022-09-06 DIAGNOSIS — K7030 Alcoholic cirrhosis of liver without ascites: Secondary | ICD-10-CM | POA: Insufficient documentation

## 2022-09-06 DIAGNOSIS — K8020 Calculus of gallbladder without cholecystitis without obstruction: Secondary | ICD-10-CM | POA: Diagnosis not present

## 2022-09-06 LAB — ANTI-SMOOTH MUSCLE ANTIBODY, IGG: Smooth Muscle Ab: 9 U (ref 0–19)

## 2022-09-06 LAB — MITOCHONDRIAL ANTIBODIES: Mitochondrial Ab: <20 U (ref 0.0–20.0)

## 2022-09-06 NOTE — Telephone Encounter (Signed)
Informed Melinda Giles, pt's aunt, on [place] of pre-op appointment date and time.

## 2022-09-07 ENCOUNTER — Ambulatory Visit: Payer: 59 | Admitting: Gastroenterology

## 2022-09-16 ENCOUNTER — Ambulatory Visit: Payer: 59 | Admitting: Internal Medicine

## 2022-09-22 ENCOUNTER — Inpatient Hospital Stay: Payer: 59 | Attending: Oncology | Admitting: Oncology

## 2022-09-22 ENCOUNTER — Encounter: Payer: Self-pay | Admitting: Oncology

## 2022-09-22 VITALS — BP 115/80 | HR 114 | Temp 98.7°F | Resp 18 | Ht 65.0 in | Wt 267.0 lb

## 2022-09-22 DIAGNOSIS — E119 Type 2 diabetes mellitus without complications: Secondary | ICD-10-CM | POA: Diagnosis not present

## 2022-09-22 DIAGNOSIS — R7989 Other specified abnormal findings of blood chemistry: Secondary | ICD-10-CM | POA: Insufficient documentation

## 2022-09-22 DIAGNOSIS — Z793 Long term (current) use of hormonal contraceptives: Secondary | ICD-10-CM | POA: Insufficient documentation

## 2022-09-22 DIAGNOSIS — Z7984 Long term (current) use of oral hypoglycemic drugs: Secondary | ICD-10-CM | POA: Diagnosis not present

## 2022-09-22 DIAGNOSIS — K824 Cholesterolosis of gallbladder: Secondary | ICD-10-CM | POA: Insufficient documentation

## 2022-09-22 DIAGNOSIS — Z87891 Personal history of nicotine dependence: Secondary | ICD-10-CM | POA: Insufficient documentation

## 2022-09-22 DIAGNOSIS — E785 Hyperlipidemia, unspecified: Secondary | ICD-10-CM | POA: Diagnosis not present

## 2022-09-22 DIAGNOSIS — I10 Essential (primary) hypertension: Secondary | ICD-10-CM | POA: Insufficient documentation

## 2022-09-22 DIAGNOSIS — K7460 Unspecified cirrhosis of liver: Secondary | ICD-10-CM | POA: Insufficient documentation

## 2022-09-22 DIAGNOSIS — K8020 Calculus of gallbladder without cholecystitis without obstruction: Secondary | ICD-10-CM | POA: Diagnosis not present

## 2022-09-22 DIAGNOSIS — Z79899 Other long term (current) drug therapy: Secondary | ICD-10-CM | POA: Diagnosis not present

## 2022-09-22 NOTE — Progress Notes (Signed)
[facility] HEMATOLOGY NEW VISIT  [name], MD  REASON FOR REFERRAL: Elevated Ferritin   HISTORY OF PRESENT ILLNESS: Melinda Giles 52 y.o. female referred for elevated ferritin.  She has a past medical history of liver cirrhosis, hypertension, hyperlipidemia, diabetes.  She is accompanied by her mother today.  She reported no complaints today.  She denies abdominal pain, nausea, vomiting, loss of appetite, weight loss.  She denies shortness of breath, sleeping difficulties.  She has been doing well overall.   Patient was a heavy alcohol user and has stopped drinking alcohol in 2020.  She also quit smoking in 2020.  She has no family history of hemochromatosis or any blood conditions.  I have reviewed the past medical history, past surgical history, social history and family history with the patient   ALLERGIES:  is allergic to lisinopril and other.  MEDICATIONS:  Current Outpatient Medications  Medication Sig Dispense Refill   atorvastatin (LIPITOR) 10 MG tablet Take 1 tablet (10 mg total) by mouth daily. 90 tablet 0   Blood Glucose Monitoring Suppl DEVI 1 each by Does not apply route in the morning, at noon, and at bedtime. May substitute to any manufacturer covered by patient's insurance. 1 each 0   Blood Pressure Monitoring (ADULT BLOOD PRESSURE CUFF LG) KIT Check blood pressure daily. 1 kit 0   Cholecalciferol (VITAMIN D-3) 25 MCG (1000 UT) CAPS Take by mouth.     Continuous Glucose Receiver (FREESTYLE LIBRE 3 READER) DEVI 1 Device by Does not apply route continuous. 1 each 0   Continuous Glucose Sensor (FREESTYLE LIBRE 3 SENSOR) MISC 1 Device by Does not apply route every 14 (fourteen) days. 1 each 3   ibuprofen (ADVIL) 800 MG tablet Take 1 tablet (800 mg total) by mouth every 8 (eight) hours as needed. 30 tablet 2   losartan (COZAAR) 25 MG tablet Take 1 tablet (25 mg total) by mouth daily. 90 tablet 1   medroxyPROGESTERone  (DEPO-PROVERA) 150 MG/ML injection Inject 1 mL (150 mg total) into the muscle every 3 (three) months. 1 mL 3   metFORMIN (GLUCOPHAGE) 500 MG tablet Take 2 tablets (1,000 mg total) by mouth 2 (two) times daily with a meal. 120 tablet 2   metoprolol succinate (TOPROL-XL) 25 MG 24 hr tablet Take 1 tablet (25 mg total) by mouth daily. 90 tablet 3   Na Sulfate-K Sulfate-Mg Sulf 17.5-3.13-1.6 GM/177ML SOLN As directed 354 mL 0   pregabalin (LYRICA) 150 MG capsule Take 1 capsule (150 mg total) by mouth 2 (two) times daily. 60 capsule 5   No current facility-administered medications for this visit.     REVIEW OF SYSTEMS:   Constitutional: Denies fevers, chills or night sweats Eyes: Denies blurriness of vision Ears, nose, mouth, throat, and face: Denies mucositis or sore throat Respiratory: Denies cough, dyspnea or wheezes Cardiovascular: Denies palpitation, chest discomfort or lower extremity swelling Gastrointestinal:  Denies nausea, heartburn or change in bowel habits Skin: Denies abnormal skin rashes Lymphatics: Denies new lymphadenopathy or easy bruising Neurological:Denies numbness, tingling or new weaknesses Behavioral/Psych: Mood is stable, no new changes  All other systems were reviewed with the patient and are negative.  PHYSICAL EXAMINATION:   Vitals:   09/22/22 1415  BP: 115/80  Pulse: (!) 114  Resp: 18  Temp: 98.7 F (37.1 C)  SpO2: 99%    GENERAL:alert, no distress and comfortable LUNGS: clear to auscultation and percussion with normal breathing effort HEART: regular rate &  rhythm and no murmurs and no lower extremity edema ABDOMEN:abdomen soft, non-tender and normal bowel sounds Musculoskeletal:no cyanosis of digits and no clubbing  NEURO: alert & oriented x 3 with fluent speech, no focal motor/sensory deficits  LABORATORY DATA:  I have reviewed the data as listed  Lab Results  Component Value Date   WBC 11.6 (H) 06/01/2022   NEUTROABS 7.6 (H) 06/01/2022   HGB  13.9 06/01/2022   HCT 40.7 06/01/2022   MCV 89 06/01/2022   PLT 313 06/01/2022      Component Value Date/Time   NA 141 06/01/2022 1102   K 4.4 06/01/2022 1102   CL 106 06/01/2022 1102   CO2 19 (L) 06/01/2022 1102   GLUCOSE 108 (H) 06/01/2022 1102   GLUCOSE 122 (H) 10/10/2020 1112   BUN 9 06/01/2022 1102   CREATININE 0.83 06/01/2022 1102   CALCIUM 9.6 06/01/2022 1102   PROT 7.5 06/01/2022 1102   ALBUMIN 4.0 06/01/2022 1102   AST 17 06/01/2022 1102   ALT 17 06/01/2022 1102   ALKPHOS 59 06/01/2022 1102   BILITOT 0.9 06/01/2022 1102   GFRNONAA >60 10/10/2020 1112   GFRAA >60 10/20/2017 1527        Chemistry      Component Value Date/Time   NA 141 06/01/2022 1102   K 4.4 06/01/2022 1102   CL 106 06/01/2022 1102   CO2 19 (L) 06/01/2022 1102   BUN 9 06/01/2022 1102   CREATININE 0.83 06/01/2022 1102      Component Value Date/Time   CALCIUM 9.6 06/01/2022 1102   ALKPHOS 59 06/01/2022 1102   AST 17 06/01/2022 1102   ALT 17 06/01/2022 1102   BILITOT 0.9 06/01/2022 1102     Lab Results  Component Value Date   IRON 86 06/01/2022   TIBC 294 06/01/2022   FERRITIN 404 (H) 09/01/2022     RADIOGRAPHIC STUDIES: RUQ [place]: 09/06/2022: IMPRESSION: 1. Cholelithiasis without sonographic evidence of acute cholecystitis. 2. 8 mm gallbladder polyp. Recommend follow-up ultrasound in 6 months to ensure stability. 3. Increased echotexture of the liver with mild nodular contour. This is nonspecific but can be seen in cirrhosis. No focal liver lesion identified.    ASSESSMENT & PLAN:  Patient is a 52 year old female with past medical history of alcoholic cirrhosis, diabetes, hypertension referred for elevated ferritin   Elevated ferritin Patient's elevated ferritin likely secondary to liver cirrhosis Recent ferritin of 404 Hemochromatosis testing 07/28/2020 was negative No indication for phlebotomy for secondary hemochromatosis at this time Continue to monitor Return to clinic  in 6 months with labs   No orders of the defined types were placed in this encounter.   The total time spent in the appointment was 40 minutes encounter with patients including review of chart and various tests results, discussions about plan of care and coordination of care plan   All questions were answered. The patient knows to call the clinic with any problems, questions or concerns. No barriers to learning was detected.   Cindie Crumbly, MD 9/4/20243:15 PM

## 2022-09-22 NOTE — Assessment & Plan Note (Addendum)
Patient's elevated ferritin likely secondary to liver cirrhosis Recent ferritin of 404 Hemochromatosis testing 07/28/2020 was negative No indication for phlebotomy for secondary hemochromatosis at this time Continue to monitor Return to clinic in 6 months with labs

## 2022-09-27 ENCOUNTER — Ambulatory Visit: Payer: 59 | Admitting: Nutrition

## 2022-09-28 NOTE — Patient Instructions (Signed)
   Your procedure is scheduled on: 10/04/2022  Report to [facility] Main Entrance at  9:45   AM.  Call this number if you have problems the morning of surgery: [phone]   Remember:              Follow Directions on the letter you received from Your Physician's office regarding the Bowel Prep              No Smoking the day of Procedure :   Take these medicines the morning of surgery with A SIP OF WATER: Metoprolol and Lyrica              No diabetic medication morning of procedure   Do not wear jewelry, make-up or nail polish.    Do not bring valuables to the hospital.  Contacts, dentures or bridgework may not be worn into surgery.  .   Patients discharged the day of surgery will not be allowed to drive home.     Colonoscopy, Adult, Care After This sheet gives you information about how to care for yourself after your procedure. Your health care provider may also give you more specific instructions. If you have problems or questions, contact your health care provider. What can I expect after the procedure? After the procedure, it is common to have: A small amount of blood in your stool for 24 hours after the procedure. Some gas. Mild abdominal cramping or bloating.  Follow these instructions at home: General instructions  For the first 24 hours after the procedure: Do not drive or use machinery. Do not sign important documents. Do not drink alcohol. Do your regular daily activities at a slower pace than normal. Eat soft, easy-to-digest foods. Rest often. Take over-the-counter or prescription medicines only as told by your health care provider. It is up to you to get the results of your procedure. Ask your health care provider, or the department performing the procedure, when your results will be ready. Relieving cramping and bloating Try walking around when you have cramps or feel bloated. Apply heat to your abdomen as told by your health care provider. Use a heat  source that your health care provider recommends, such as a moist heat pack or a heating pad. Place a towel between your skin and the heat source. Leave the heat on for 20-30 minutes. Remove the heat if your skin turns bright red. This is especially important if you are unable to feel pain, heat, or cold. You may have a greater risk of getting burned. Eating and drinking Drink enough fluid to keep your urine clear or pale yellow. Resume your normal diet as instructed by your health care provider. Avoid heavy or fried foods that are hard to digest. Avoid drinking alcohol for as long as instructed by your health care provider. Contact a health care provider if: You have blood in your stool 2-3 days after the procedure. Get help right away if: You have more than a small spotting of blood in your stool. You [place] large blood clots in your stool. Your abdomen is swollen. You have nausea or vomiting. You have a fever. You have increasing abdominal pain that is not relieved with medicine. This information is not intended to replace advice given to you by your health care provider. Make sure you discuss any questions you have with your health care provider. Document Released: 08/19/2003 Document Revised: 09/29/2015 Document Reviewed: 03/18/2015 Elsevier Interactive Patient Education  [employer].

## 2022-09-29 ENCOUNTER — Encounter (HOSPITAL_COMMUNITY): Payer: Self-pay

## 2022-09-29 ENCOUNTER — Encounter (HOSPITAL_COMMUNITY)
Admission: RE | Admit: 2022-09-29 | Discharge: 2022-09-29 | Disposition: A | Discharge status: Home / Self Care | Payer: 59 | Source: Ambulatory Visit | Attending: Internal Medicine

## 2022-09-29 VITALS — BP 115/80 | HR 100 | Temp 98.7°F | Resp 18 | Ht 65.0 in | Wt 267.0 lb

## 2022-09-29 DIAGNOSIS — Z01810 Encounter for preprocedural cardiovascular examination: Secondary | ICD-10-CM | POA: Diagnosis not present

## 2022-09-29 DIAGNOSIS — E1100 Type 2 diabetes mellitus with hyperosmolarity without nonketotic hyperglycemic-hyperosmolar coma (NKHHC): Secondary | ICD-10-CM

## 2022-09-29 DIAGNOSIS — K7460 Unspecified cirrhosis of liver: Secondary | ICD-10-CM

## 2022-09-29 DIAGNOSIS — R9431 Abnormal electrocardiogram [ECG] [EKG]: Secondary | ICD-10-CM | POA: Insufficient documentation

## 2022-09-29 DIAGNOSIS — K7030 Alcoholic cirrhosis of liver without ascites: Secondary | ICD-10-CM

## 2022-09-29 DIAGNOSIS — Z01812 Encounter for preprocedural laboratory examination: Secondary | ICD-10-CM | POA: Diagnosis not present

## 2022-09-29 DIAGNOSIS — Z01818 Encounter for other preprocedural examination: Secondary | ICD-10-CM | POA: Diagnosis present

## 2022-09-29 DIAGNOSIS — I10 Essential (primary) hypertension: Secondary | ICD-10-CM

## 2022-09-29 LAB — CBC WITH DIFFERENTIAL/PLATELET
Abs Immature Granulocytes: 0.08 10*3/uL — ABNORMAL HIGH (ref 0.00–0.07)
Basophils Absolute: 0.1 10*3/uL (ref 0.0–0.1)
Basophils Relative: 1 %
Eosinophils Absolute: 0.2 10*3/uL (ref 0.0–0.5)
Eosinophils Relative: 1 %
HCT: 37.2 % (ref 36.0–46.0)
Hemoglobin: 12.3 g/dL (ref 12.0–15.0)
Immature Granulocytes: 1 %
Lymphocytes Relative: 22 %
Lymphs Abs: 2.8 10*3/uL (ref 0.7–4.0)
MCH: 30.6 pg (ref 26.0–34.0)
MCHC: 33.1 g/dL (ref 30.0–36.0)
MCV: 92.5 fL (ref 80.0–100.0)
Monocytes Absolute: 0.7 10*3/uL (ref 0.1–1.0)
Monocytes Relative: 5 %
Neutro Abs: 9.2 10*3/uL — ABNORMAL HIGH (ref 1.7–7.7)
Neutrophils Relative %: 70 %
Platelets: 331 10*3/uL (ref 150–400)
RBC: 4.02 MIL/uL (ref 3.87–5.11)
RDW: 13.2 % (ref 11.5–15.5)
WBC: 13 10*3/uL — ABNORMAL HIGH (ref 4.0–10.5)
nRBC: 0 % (ref 0.0–0.2)

## 2022-09-29 LAB — COMPREHENSIVE METABOLIC PANEL
ALT: 16 U/L (ref 0–44)
AST: 15 U/L (ref 15–41)
Albumin: 3.4 g/dL — ABNORMAL LOW (ref 3.5–5.0)
Alkaline Phosphatase: 49 U/L (ref 38–126)
Anion gap: 8 (ref 5–15)
BUN: 10 mg/dL (ref 6–20)
CO2: 24 mmol/L (ref 22–32)
Calcium: 8.6 mg/dL — ABNORMAL LOW (ref 8.9–10.3)
Chloride: 102 mmol/L (ref 98–111)
Creatinine, Ser: 0.72 mg/dL (ref 0.44–1.00)
GFR, Estimated: >60 mL/min (ref >60)
Glucose, Bld: 205 mg/dL — ABNORMAL HIGH (ref 70–99)
Potassium: 3.6 mmol/L (ref 3.5–5.1)
Sodium: 134 mmol/L — ABNORMAL LOW (ref 135–145)
Total Bilirubin: 0.8 mg/dL (ref 0.3–1.2)
Total Protein: 7.5 g/dL (ref 6.5–8.1)

## 2022-09-29 LAB — PROTIME-INR
INR: 1.1 (ref 0.8–1.2)
Prothrombin Time: 14.8 s (ref 11.4–15.2)

## 2022-09-30 ENCOUNTER — Ambulatory Visit (INDEPENDENT_AMBULATORY_CARE_PROVIDER_SITE_OTHER): Payer: 59 | Admitting: *Deleted

## 2022-09-30 ENCOUNTER — Telehealth: Payer: Self-pay | Admitting: Internal Medicine

## 2022-09-30 DIAGNOSIS — Z3042 Encounter for surveillance of injectable contraceptive: Secondary | ICD-10-CM

## 2022-09-30 MED ORDER — MEDROXYPROGESTERONE ACETATE 150 MG/ML IM SUSY
150.0000 mg | PREFILLED_SYRINGE | Freq: Once | INTRAMUSCULAR | Status: AC
Start: 2022-09-30 — End: 2022-09-30
  Administered 2022-09-30: 150 mg via INTRAMUSCULAR

## 2022-09-30 NOTE — Telephone Encounter (Signed)
CAP  Noted  Copied Sleeved  Original in PCP box Copy front desk folder

## 2022-09-30 NOTE — Progress Notes (Signed)
   NURSE VISIT- INJECTION  SUBJECTIVE:  Melinda Giles is a 52 y.o. G19P1001 female here for a Depo Provera for contraception/period management. She is a GYN patient.   OBJECTIVE:  LMP  (LMP Unknown) Comment: > 2 years ago  Appears well, in no apparent distress  Injection administered in: Left deltoid  Meds ordered this encounter  Medications   medroxyPROGESTERone Acetate SUSY 150 mg    ASSESSMENT: GYN patient Depo Provera for contraception/period management PLAN: Follow-up: in 11-13 weeks for next Depo   Jobe Marker  09/30/2022 9:54 AM

## 2022-10-04 ENCOUNTER — Ambulatory Visit (HOSPITAL_COMMUNITY)
Admission: RE | Admit: 2022-10-04 | Discharge: 2022-10-04 | Disposition: A | Discharge status: Home / Self Care | Payer: 59 | Attending: Internal Medicine | Admitting: Internal Medicine

## 2022-10-04 ENCOUNTER — Encounter (HOSPITAL_COMMUNITY): Payer: Self-pay

## 2022-10-04 ENCOUNTER — Ambulatory Visit (HOSPITAL_COMMUNITY): Payer: 59 | Admitting: Anesthesiology

## 2022-10-04 ENCOUNTER — Encounter (HOSPITAL_COMMUNITY)
Admission: RE | Disposition: A | Discharge status: Home / Self Care | Payer: Self-pay | Source: Home / Self Care | Attending: Internal Medicine

## 2022-10-04 ENCOUNTER — Ambulatory Visit (HOSPITAL_BASED_OUTPATIENT_CLINIC_OR_DEPARTMENT_OTHER): Payer: 59 | Admitting: Anesthesiology

## 2022-10-04 DIAGNOSIS — K649 Unspecified hemorrhoids: Secondary | ICD-10-CM | POA: Diagnosis not present

## 2022-10-04 DIAGNOSIS — I10 Essential (primary) hypertension: Secondary | ICD-10-CM

## 2022-10-04 DIAGNOSIS — E119 Type 2 diabetes mellitus without complications: Secondary | ICD-10-CM | POA: Insufficient documentation

## 2022-10-04 DIAGNOSIS — F209 Schizophrenia, unspecified: Secondary | ICD-10-CM | POA: Diagnosis not present

## 2022-10-04 DIAGNOSIS — Z87891 Personal history of nicotine dependence: Secondary | ICD-10-CM | POA: Diagnosis not present

## 2022-10-04 DIAGNOSIS — K7460 Unspecified cirrhosis of liver: Secondary | ICD-10-CM | POA: Diagnosis not present

## 2022-10-04 DIAGNOSIS — Z1211 Encounter for screening for malignant neoplasm of colon: Secondary | ICD-10-CM | POA: Insufficient documentation

## 2022-10-04 DIAGNOSIS — Z7984 Long term (current) use of oral hypoglycemic drugs: Secondary | ICD-10-CM | POA: Insufficient documentation

## 2022-10-04 HISTORY — PX: COLONOSCOPY WITH PROPOFOL: SHX5780

## 2022-10-04 LAB — GLUCOSE, CAPILLARY: Glucose-Capillary: 110 mg/dL — ABNORMAL HIGH (ref 70–99)

## 2022-10-04 SURGERY — COLONOSCOPY WITH PROPOFOL
Anesthesia: General

## 2022-10-04 MED ORDER — PROPOFOL 10 MG/ML IV BOLUS
INTRAVENOUS | Status: DC | PRN
  Administered 2022-10-04: 30 mg via INTRAVENOUS
  Administered 2022-10-04: 40 mg via INTRAVENOUS
  Administered 2022-10-04: 130 mg via INTRAVENOUS

## 2022-10-04 MED ORDER — LACTATED RINGERS IV SOLN: INTRAVENOUS | Status: DC

## 2022-10-04 MED ORDER — LIDOCAINE HCL (CARDIAC) PF 100 MG/5ML IV SOSY
PREFILLED_SYRINGE | INTRAVENOUS | Status: DC | PRN
  Administered 2022-10-04: 50 mg via INTRAVENOUS
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
## 2022-10-04 NOTE — Op Note (Signed)
[facility] Patient Name: Melinda Giles Procedure Date: 10/04/2022 10:02 AM MRN: [id number] Date of Birth: 1970/04/26 Attending MD: Hennie Duos. Marletta Lor , [place], [id number] CSN: [id number] Age: 52 Admit Type: Outpatient Procedure:                Colonoscopy Indications:              Screening for colorectal malignant neoplasm Providers:                Hennie Duos. Marletta Lor, DO, Sheran Fava, Pandora Leiter, Technician Referring MD:              Medicines:                See the Anesthesia note for documentation of the                            administered medications Complications:            No immediate complications. Estimated Blood Loss:     Estimated blood loss: none. Procedure:                Pre-Anesthesia Assessment:                           - The anesthesia plan was to use monitored                            anesthesia care (MAC).                           After obtaining informed consent, the colonoscope                            was passed under direct vision. Throughout the                            procedure, the patient's blood pressure, pulse, and                            oxygen saturations were monitored continuously. The                            [id number]-HQ190L ([id number]) scope was introduced through                            the anus and advanced to the the cecum, identified                            by appendiceal orifice and ileocecal valve. The                            colonoscopy was performed without difficulty. The                            patient tolerated the procedure well. The quality  of the bowel preparation was evaluated using the                            BBPS [facility] Bowel Preparation Scale) with scores                            of: Right Colon = 1 (portion of mucosa seen, but                            other areas not well seen due to staining, residual                            stool  and/or opaque liquid), Transverse Colon = 1                            (portion of mucosa seen, but other areas not well                            seen due to staining, residual stool and/or opaque                            liquid) and Left Colon = 1 (portion of mucosa seen,                            but other areas not well seen due to staining,                            residual stool and/or opaque liquid). The total                            BBPS score equals 3. The quality of the bowel                            preparation was inadequate. Scope In: 10:44:19 AM Scope Out: 10:52:36 AM Scope Withdrawal Time: 0 hours 4 minutes 43 seconds  Total Procedure Duration: 0 hours 8 minutes 17 seconds  Findings:      Hemorrhoids were found on perianal exam.      Extensive amounts of semi-solid stool was found in the entire colon,       precluding visualization. Impression:               - Preparation of the colon was inadequate.                           - Hemorrhoids found on perianal exam.                           - Stool in the entire examined colon.                           - No specimens collected. Moderate Sedation:      Per Anesthesia Care Recommendation:           - Patient has  a contact number available for                            emergencies. The signs and symptoms of potential                            delayed complications were discussed with the                            patient. Return to normal activities tomorrow.                            Written discharge instructions were provided to the                            patient.                           - Resume previous diet.                           - Continue present medications.                           - Repeat colonoscopy in 3-6 months because the                            bowel preparation was poor.                           - Return to GI clinic in 3 months. Procedure Code(s):        --- Professional  ---                           W0981, Colorectal cancer screening; colonoscopy on                            individual not meeting criteria for high risk Diagnosis Code(s):        --- Professional ---                           Z12.11, Encounter for screening for malignant                            neoplasm of colon                           K64.9, Unspecified hemorrhoids CPT copyright 2022 American Medical Association. All rights reserved. The codes documented in this report are preliminary and upon coder review may  be revised to meet current compliance requirements. Hennie Duos. Marletta Lor, DO Hennie Duos. Marletta Lor, DO 10/04/2022 10:55:17 AM This report has been signed electronically. Number of Addenda: 0

## 2022-10-04 NOTE — Anesthesia Procedure Notes (Signed)
Date/Time: 10/04/2022 10:45 AM  Performed by: Julian Reil, CRNAPre-anesthesia Checklist: Patient identified, Emergency Drugs available, Suction available and Patient being monitored Patient Re-evaluated:Patient Re-evaluated prior to induction Oxygen Delivery Method: Nasal cannula Induction Type: IV induction Placement Confirmation: positive ETCO2 Comments: Optiflow High Flow Bryant O2 used.

## 2022-10-04 NOTE — Transfer of Care (Signed)
Immediate Anesthesia Transfer of Care Note  Patient: Melinda Giles  Procedure(s) Performed: COLONOSCOPY WITH PROPOFOL  Patient Location: Short Stay  Anesthesia Type:General  Level of Consciousness: awake, alert , and oriented  Airway & Oxygen Therapy: Patient Spontanous Breathing  Post-op Assessment: Report given to RN and Post -op Vital signs reviewed and stable  Post vital signs: Reviewed and stable  Last Vitals:  Vitals Value Taken Time  BP 107/60 10/04/22 1057  Temp 36.7 C 10/04/22 1057  Pulse 89 10/04/22 1057  Resp 19 10/04/22 1057  SpO2 100 % 10/04/22 1057    Last Pain:  Vitals:   10/04/22 1057  TempSrc: Oral  PainSc: 0-No pain         Complications: No notable events documented.

## 2022-10-04 NOTE — Anesthesia Preprocedure Evaluation (Signed)
Anesthesia Evaluation  Patient identified by MRN, date of birth, ID band Patient awake    Reviewed: Allergy & Precautions, H&P , NPO status , Patient's Chart, lab work & pertinent test results, reviewed documented beta blocker date and time   Airway Mallampati: II  TM Distance: >3 FB Neck ROM: full    Dental no notable dental hx.    Pulmonary neg pulmonary ROS, former smoker   Pulmonary exam normal breath sounds clear to auscultation       Cardiovascular Exercise Tolerance: Good hypertension, negative cardio ROS  Rhythm:regular Rate:Normal     Neuro/Psych Seizures -,  PSYCHIATRIC DISORDERS    Schizophrenia   Neuromuscular disease negative neurological ROS  negative psych ROS   GI/Hepatic negative GI ROS,,,(+) Cirrhosis         Endo/Other  negative endocrine ROSdiabetes    Renal/GU negative Renal ROS  negative genitourinary   Musculoskeletal   Abdominal   Peds  Hematology negative hematology ROS (+) Blood dyscrasia, anemia   Anesthesia Other Findings   Reproductive/Obstetrics negative OB ROS                             Anesthesia Physical Anesthesia Plan  ASA: 3  Anesthesia Plan: General   Post-op Pain Management:    Induction:   PONV Risk Score and Plan: Propofol infusion  Airway Management Planned:   Additional Equipment:   Intra-op Plan:   Post-operative Plan:   Informed Consent: I have reviewed the patients History and Physical, chart, labs and discussed the procedure including the risks, benefits and alternatives for the proposed anesthesia with the patient or authorized representative who has indicated his/her understanding and acceptance.     Dental Advisory Given  Plan Discussed with: CRNA  Anesthesia Plan Comments:        Anesthesia Quick Evaluation

## 2022-10-04 NOTE — Discharge Instructions (Signed)
  Colonoscopy Discharge Instructions  Read the instructions outlined below and refer to this sheet in the next few weeks. These discharge instructions provide you with general information on caring for yourself after you leave the hospital. Your doctor may also give you specific instructions. While your treatment has been planned according to the most current medical practices available, unavoidable complications occasionally occur.   ACTIVITY You may resume your regular activity, but move at a slower pace for the next 24 hours.  Take frequent rest periods for the next 24 hours.  Walking will help get rid of the air and reduce the bloated feeling in your belly (abdomen).  No driving for 24 hours (because of the medicine (anesthesia) used during the test).   Do not sign any important legal documents or operate any machinery for 24 hours (because of the anesthesia used during the test).  NUTRITION Drink plenty of fluids.  You may resume your normal diet as instructed by your doctor.  Begin with a light meal and progress to your normal diet. Heavy or fried foods are harder to digest and may make you feel sick to your stomach (nauseated).  Avoid alcoholic beverages for 24 hours or as instructed.  MEDICATIONS You may resume your normal medications unless your doctor tells you otherwise.  WHAT YOU CAN EXPECT TODAY Some feelings of bloating in the abdomen.  Passage of more gas than usual.  Spotting of blood in your stool or on the toilet paper.  IF YOU HAD POLYPS REMOVED DURING THE COLONOSCOPY: No aspirin products for 7 days or as instructed.  No alcohol for 7 days or as instructed.  Eat a soft diet for the next 24 hours.  FINDING OUT THE RESULTS OF YOUR TEST Not all test results are available during your visit. If your test results are not back during the visit, make an appointment with your caregiver to find out the results. Do not assume everything is normal if you have not heard from your  caregiver or the medical facility. It is important for you to follow up on all of your test results.  SEEK IMMEDIATE MEDICAL ATTENTION IF: You have more than a spotting of blood in your stool.  Your belly is swollen (abdominal distention).  You are nauseated or vomiting.  You have a temperature over 101.  You have abdominal pain or discomfort that is severe or gets worse throughout the day.   Unfortunately, your colon was not adequately prepped today for colonoscopy.  I did not see any evidence of colon cancer or large polyps, but certainly could have missed smaller polyps due to poor visualization.  Would recommend repeat colonoscopy in 3-6 months with a different colon prep.  Follow up in our office to discuss further.   I hope you have a great rest of your week!  Hennie Duos. Marletta Lor, D.O. [facility]

## 2022-10-07 ENCOUNTER — Encounter: Payer: Self-pay | Admitting: Nutrition

## 2022-10-07 NOTE — Telephone Encounter (Signed)
Called patient will come in to pick up forms today

## 2022-10-08 NOTE — Telephone Encounter (Signed)
Forms picked up

## 2022-10-09 NOTE — Anesthesia Postprocedure Evaluation (Signed)
Anesthesia Post Note  Patient: Melinda Giles  Procedure(s) Performed: COLONOSCOPY WITH PROPOFOL  Patient location during evaluation: Phase II Anesthesia Type: General Level of consciousness: awake Pain management: pain level controlled Vital Signs Assessment: post-procedure vital signs reviewed and stable Respiratory status: spontaneous breathing and respiratory function stable Cardiovascular status: blood pressure returned to baseline and stable Postop Assessment: no headache and no apparent nausea or vomiting Anesthetic complications: no Comments: Late entry   No notable events documented.   Last Vitals:  Vitals:   10/04/22 0846 10/04/22 1057  BP: 126/89 107/60  Pulse: 82 89  Resp: 17 19  Temp: 36.8 C 36.7 C  SpO2: 98% 100%    Last Pain:  Vitals:   10/04/22 1057  TempSrc: Oral  PainSc: 0-No pain                 Windell Norfolk

## 2022-10-13 ENCOUNTER — Encounter (HOSPITAL_COMMUNITY): Payer: Self-pay | Admitting: Internal Medicine

## 2022-10-19 ENCOUNTER — Ambulatory Visit: Payer: 59 | Admitting: Internal Medicine

## 2022-10-19 ENCOUNTER — Encounter: Payer: Self-pay | Admitting: Internal Medicine

## 2022-10-19 VITALS — BP 113/76 | HR 79 | Resp 16 | Ht 65.0 in | Wt 262.0 lb

## 2022-10-19 DIAGNOSIS — I10 Essential (primary) hypertension: Secondary | ICD-10-CM

## 2022-10-19 DIAGNOSIS — E1169 Type 2 diabetes mellitus with other specified complication: Secondary | ICD-10-CM | POA: Diagnosis not present

## 2022-10-19 DIAGNOSIS — Z7984 Long term (current) use of oral hypoglycemic drugs: Secondary | ICD-10-CM

## 2022-10-19 DIAGNOSIS — K7030 Alcoholic cirrhosis of liver without ascites: Secondary | ICD-10-CM | POA: Diagnosis not present

## 2022-10-19 DIAGNOSIS — Z87891 Personal history of nicotine dependence: Secondary | ICD-10-CM

## 2022-10-19 DIAGNOSIS — Z23 Encounter for immunization: Secondary | ICD-10-CM

## 2022-10-19 DIAGNOSIS — E119 Type 2 diabetes mellitus without complications: Secondary | ICD-10-CM

## 2022-10-19 DIAGNOSIS — E785 Hyperlipidemia, unspecified: Secondary | ICD-10-CM | POA: Diagnosis not present

## 2022-10-19 NOTE — Assessment & Plan Note (Signed)
Followed by gastroenterology.  Last evaluated in August.  Referred to hematology for evaluation of an elevated ferritin level, which has ultimately been attributed to cirrhosis.  No indication for phlebotomy currently.  She will follow-up with hematology in 6 months. RUQ U/S demonstrated findings consistent with cirrhosis as well as an 8 mm gallbladder polyp.  Repeat ultrasound recommended for 6 months.

## 2022-10-19 NOTE — Assessment & Plan Note (Signed)
A1c 7.2 in May.  Metformin was increased to 1000 mg twice daily.  She has been evaluated by medical nutrition therapy and is working on making dietary changes aimed at weight loss and lowering her blood sugar. -Repeat A1c at follow-up in 3 months.  Consider adding GLP-1 therapy at that time.  This was discussed with the patient today. -Previously referred for diabetic eye exam.  Has contact information to call and schedule an appointment

## 2022-10-19 NOTE — Assessment & Plan Note (Signed)
She quit smoking in 2021.  Previously smoked 1 pack/day for 30 years.  She has been referred for lung cancer screening and needs to call to schedule imaging.
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
## 2022-10-19 NOTE — Patient Instructions (Signed)
It was a pleasure to see you today.  Thank you for giving [place] the opportunity to be involved in your care.  Below is a brief recap of your visit and next steps.  We will plan to see you again in 3 months.  Summary No medication changes today We will plan for follow up in 3 months Please call to schedule lung cancer screening Flu shot today

## 2022-10-19 NOTE — Assessment & Plan Note (Signed)
Influenza vaccine administered today.

## 2022-10-19 NOTE — Assessment & Plan Note (Signed)
Lipid panel updated in May.  Total cholesterol 61 and LDL 12.  She is currently prescribed atorvastatin 10 mg daily.  No medication changes have been made today.

## 2022-10-19 NOTE — Addendum Note (Signed)
Addended by: Christel Mormon E on: 10/19/2022 10:19 AM   Modules accepted: Level of Service

## 2022-10-19 NOTE — Assessment & Plan Note (Signed)
Losartan 25 mg daily was added for renal protection in setting of diabetes mellitus at her last appointment.  Metoprolol to tartrate was also discontinued in favor of Toprol-XL 25 mg daily.  BP remains well-controlled.  No additional changes are indicated today.

## 2022-10-21 ENCOUNTER — Other Ambulatory Visit: Payer: Self-pay | Admitting: *Deleted

## 2022-10-21 DIAGNOSIS — Z87891 Personal history of nicotine dependence: Secondary | ICD-10-CM

## 2022-10-21 DIAGNOSIS — Z122 Encounter for screening for malignant neoplasm of respiratory organs: Secondary | ICD-10-CM

## 2022-10-25 ENCOUNTER — Other Ambulatory Visit: Payer: Self-pay

## 2022-10-25 ENCOUNTER — Telehealth: Payer: Self-pay | Admitting: Internal Medicine

## 2022-10-25 DIAGNOSIS — E1165 Type 2 diabetes mellitus with hyperglycemia: Secondary | ICD-10-CM

## 2022-10-25 DIAGNOSIS — I10 Essential (primary) hypertension: Secondary | ICD-10-CM

## 2022-10-25 MED ORDER — PREGABALIN 150 MG PO CAPS: 150.0000 mg | ORAL_CAPSULE | Freq: Two times a day (BID) | ORAL | 2 refills | Status: DC

## 2022-10-25 MED ORDER — ATORVASTATIN CALCIUM 10 MG PO TABS: 10.0000 mg | ORAL_TABLET | Freq: Every day | ORAL | 0 refills | Status: DC

## 2022-10-25 MED ORDER — METFORMIN HCL 500 MG PO TABS: 1000.0000 mg | ORAL_TABLET | Freq: Two times a day (BID) | ORAL | 2 refills | Status: DC

## 2022-10-25 MED ORDER — LOSARTAN POTASSIUM 25 MG PO TABS: 25.0000 mg | ORAL_TABLET | Freq: Every day | ORAL | 1 refills | Status: DC

## 2022-10-25 NOTE — Telephone Encounter (Signed)
Prescription Request  10/25/2022  LOV: 10/19/2022  What is the name of the medication or equipment? atorvastatin (LIPITOR) 10 MG tablet [[id number]]    metFORMIN (GLUCOPHAGE) 500 MG tablet [[id number]]     losartan (COZAAR) 25 MG tablet [[id number]]   Have you contacted your pharmacy to request a refill? No   Which pharmacy would you like this sent to?  [facility]   Patient notified that their request is being sent to the clinical staff for review and that they should receive a response within 2 business days.   Please advise at  walked in

## 2022-10-25 NOTE — Telephone Encounter (Signed)
Patient asking can Dr Durwin Nora refill this medication. Per patient said he could do it for the patient.  pregabalin (LYRICA) 150 MG capsule [[id number]   [facility]

## 2022-10-25 NOTE — Telephone Encounter (Signed)
Refills sent to pharmacy. 

## 2022-10-25 NOTE — Addendum Note (Signed)
Addended by: Christel Mormon E on: 10/25/2022 04:55 PM   Modules accepted: Orders

## 2022-11-09 ENCOUNTER — Encounter: Payer: Self-pay | Admitting: Acute Care

## 2022-11-09 ENCOUNTER — Ambulatory Visit: Payer: 59 | Admitting: Acute Care

## 2022-11-09 ENCOUNTER — Ambulatory Visit (HOSPITAL_COMMUNITY)
Admission: RE | Admit: 2022-11-09 | Discharge: 2022-11-09 | Disposition: A | Discharge status: Home / Self Care | Payer: 59 | Source: Ambulatory Visit | Attending: Acute Care | Admitting: Acute Care

## 2022-11-09 DIAGNOSIS — Z122 Encounter for screening for malignant neoplasm of respiratory organs: Secondary | ICD-10-CM | POA: Diagnosis not present

## 2022-11-09 DIAGNOSIS — Z87891 Personal history of nicotine dependence: Secondary | ICD-10-CM | POA: Diagnosis not present

## 2022-11-09 NOTE — Progress Notes (Signed)
Virtual Visit via Telephone Note  I connected with June Feldberg Worsley on 11/09/22 at  3:30 PM EDT by telephone and verified that I am speaking with the correct person using two identifiers.  Location: Patient:  At home Provider: [address] [place], [place], [address]    I discussed the limitations, risks, security and privacy concerns of performing an evaluation and management service by telephone and the availability of in person appointments. I also discussed with the patient that there may be a patient responsible charge related to this service. The patient expressed understanding and agreed to proceed.   Shared Decision Making Visit Lung Cancer Screening Program [phone])   Eligibility: Age 52 y.o. Pack Years Smoking History Calculation 20 pack year smoking history (# packs/per year x # years smoked) Recent History of coughing up blood  no Unexplained weight loss? no ( >Than 15 pounds within the last 6 months ) Prior History Lung / other cancer no (Diagnosis within the last 5 years already requiring surveillance chest CT Scans). Smoking Status Former Smoker Former Smokers: Years since quit: 2 years  Quit Date: 2022  Visit Components: Discussion included one or more decision making aids. yes Discussion included risk/benefits of screening. yes Discussion included potential follow up diagnostic testing for abnormal scans. yes Discussion included meaning and risk of over diagnosis. yes Discussion included meaning and risk of False Positives. yes Discussion included meaning of total radiation exposure. yes  Counseling Included: Importance of adherence to annual lung cancer LDCT screening. yes Impact of comorbidities on ability to participate in the program. yes Ability and willingness to under diagnostic treatment. yes  Smoking Cessation Counseling: Current Smokers:  Discussed importance of smoking cessation. yes Information about tobacco cessation classes and  interventions provided to patient. yes Patient provided with "ticket" for LDCT Scan. yes Symptomatic Patient. no  Counseling NA Diagnosis Code: Tobacco Use Z72.0 Asymptomatic Patient yes  Counseling (Intermediate counseling: > three minutes counseling) [id number] Former Smokers:  Discussed the importance of maintaining cigarette abstinence. yes Diagnosis Code: Personal History of Nicotine Dependence. W29.562 Information about tobacco cessation classes and interventions provided to patient. Yes Patient provided with "ticket" for LDCT Scan. yes Written Order for Lung Cancer Screening with LDCT placed in Epic. Yes (CT Chest Lung Cancer Screening Low Dose W/O CM) [id number] Z12.2-Screening of respiratory organs Z87.891-Personal history of nicotine dependence    Bevelyn Ngo, NP 11/09/2022

## 2022-11-09 NOTE — Patient Instructions (Signed)
 Thank you for participating in the  Lung Cancer Screening Program. It was our pleasure to meet you today. We will call you with the results of your scan within the next few days. Your scan will be assigned a Lung RADS category score by the physicians reading the scans.  This Lung RADS score determines follow up scanning.  See below for description of categories, and follow up screening recommendations. We will be in touch to schedule your follow up screening annually or based on recommendations of our providers. We will fax a copy of your scan results to your Primary Care Physician, or the physician who referred you to the program, to ensure they have the results. Please call the office if you have any questions or concerns regarding your scanning experience or results.  Our office number is [phone]. Please speak with Abigail Miyamoto, RN., Karlton Lemon RN, or Pietro Cassis RN. They are  our Lung Cancer Screening RN.'s If They are unavailable when you call, Please leave a message on the voice mail. We will return your call at our earliest convenience.This voice mail is monitored several times a day.  Remember, if your scan is normal, we will scan you annually as long as you continue to meet the criteria for the program. (Age 52-80, Current smoker or smoker who has quit within the last 15 years). If you are a smoker, remember, quitting is the single most powerful action that you can take to decrease your risk of lung cancer and other pulmonary, breathing related problems. We know quitting is hard, and we are here to help.  Please let us know if there is anything we can do to help you meet your goal of quitting. If you are a former smoker, Counselling psychologist. We are proud of you! Remain smoke free! Remember you can refer friends or family members through the number above.  We will screen them to make sure they meet criteria for the program. Thank you for helping [place] take better care of you  by participating in Lung Screening.   Lung RADS Categories:  Lung RADS 1: no nodules or definitely non-concerning nodules.  Recommendation is for a repeat annual scan in 12 months.  Lung RADS 2:  nodules that are non-concerning in appearance and behavior with a very low likelihood of becoming an active cancer. Recommendation is for a repeat annual scan in 12 months.  Lung RADS 3: nodules that are probably non-concerning , includes nodules with a low likelihood of becoming an active cancer.  Recommendation is for a 40-month repeat screening scan. Often noted after an upper respiratory illness. We will be in touch to make sure you have no questions, and to schedule your 22-month scan.  Lung RADS 4 A: nodules with concerning findings, recommendation is most often for a follow up scan in 3 months or additional testing based on our provider's assessment of the scan. We will be in touch to make sure you have no questions and to schedule the recommended 3 month follow up scan.  Lung RADS 4 B:  indicates findings that are concerning. We will be in touch with you to schedule additional diagnostic testing based on our provider's  assessment of the scan.  You can receive free nicotine replacement therapy ( patches, gum or mints) by calling 1-800-QUIT NOW. Please call so we can get you on the path to becoming  a non-smoker. I know it is hard, but you can do this!  Other options for assistance in  smoking cessation ( As covered by your insurance benefits)  Hypnosis for smoking cessation  [employer] [phone]  Acupuncture for smoking cessation  [employer] [phone]

## 2022-12-01 ENCOUNTER — Telehealth: Payer: Self-pay | Admitting: Acute Care

## 2022-12-01 DIAGNOSIS — Z7984 Long term (current) use of oral hypoglycemic drugs: Secondary | ICD-10-CM | POA: Diagnosis not present

## 2022-12-01 DIAGNOSIS — R911 Solitary pulmonary nodule: Secondary | ICD-10-CM

## 2022-12-01 DIAGNOSIS — E119 Type 2 diabetes mellitus without complications: Secondary | ICD-10-CM | POA: Diagnosis not present

## 2022-12-01 LAB — HM DIABETES EYE EXAM

## 2022-12-01 NOTE — Telephone Encounter (Signed)
Please call patient . Let her know she has a small nodule that we would like to look at again in 6 months vs 12 months. Lets do a 6 month follow up and make sure this is not growing. Due after  05/10/2023. Thanks so much. Let me know if I need to talk with her.

## 2022-12-03 NOTE — Telephone Encounter (Signed)
Called and spoke to pt. Informed her of the results of her LDCT performed on 10/26/22. Spoke with her about the 8.27mm nodule, will do 6 month follow up scan to assure stability. Also spoke with patient regarding gallstones, patient is aware and not having any acute symptoms. Informed pt of the possible cirrhosis, patient is already aware of this and had evaluation to confirm cirrhosis. Patient has PCP following up on this. Patient agreeable to 6 month follow up scan, order placed. Results and plan sent to PCP.

## 2022-12-13 ENCOUNTER — Encounter: Payer: Self-pay | Admitting: Gastroenterology

## 2022-12-21 ENCOUNTER — Ambulatory Visit (INDEPENDENT_AMBULATORY_CARE_PROVIDER_SITE_OTHER): Payer: 59

## 2022-12-21 DIAGNOSIS — Z3042 Encounter for surveillance of injectable contraceptive: Secondary | ICD-10-CM | POA: Diagnosis not present

## 2022-12-21 MED ORDER — MEDROXYPROGESTERONE ACETATE 150 MG/ML IM SUSY
150.0000 mg | PREFILLED_SYRINGE | Freq: Once | INTRAMUSCULAR | Status: AC
Start: 2022-12-21 — End: 2022-12-21
  Administered 2022-12-21: 150 mg via INTRAMUSCULAR

## 2022-12-21 NOTE — Progress Notes (Signed)
   NURSE VISIT- INJECTION  SUBJECTIVE:  Melinda Giles is a 52 y.o. G16P1001 female here for a Depo Provera for contraception/period management. She is a GYN patient.   OBJECTIVE:  LMP  (LMP Unknown) Comment: > 2 years ago  Appears well, in no apparent distress  Injection administered in: Right deltoid  Meds ordered this encounter  Medications   medroxyPROGESTERone Acetate SUSY 150 mg    ASSESSMENT: GYN patient Depo Provera for contraception/period management PLAN: Follow-up: in 11-13 weeks for next Depo   Caralyn Guile  12/21/2022 10:49 AM

## 2022-12-23 ENCOUNTER — Ambulatory Visit: Payer: 59

## 2023-01-21 ENCOUNTER — Ambulatory Visit (INDEPENDENT_AMBULATORY_CARE_PROVIDER_SITE_OTHER): Payer: 59 | Admitting: Internal Medicine

## 2023-01-21 ENCOUNTER — Encounter: Payer: Self-pay | Admitting: Internal Medicine

## 2023-01-21 VITALS — BP 126/87 | HR 94 | Ht 64.0 in | Wt 264.8 lb

## 2023-01-21 DIAGNOSIS — Z7984 Long term (current) use of oral hypoglycemic drugs: Secondary | ICD-10-CM | POA: Diagnosis not present

## 2023-01-21 DIAGNOSIS — E119 Type 2 diabetes mellitus without complications: Secondary | ICD-10-CM | POA: Diagnosis not present

## 2023-01-21 DIAGNOSIS — E785 Hyperlipidemia, unspecified: Secondary | ICD-10-CM

## 2023-01-21 DIAGNOSIS — E1169 Type 2 diabetes mellitus with other specified complication: Secondary | ICD-10-CM

## 2023-01-21 DIAGNOSIS — I10 Essential (primary) hypertension: Secondary | ICD-10-CM | POA: Diagnosis not present

## 2023-01-21 DIAGNOSIS — M160 Bilateral primary osteoarthritis of hip: Secondary | ICD-10-CM | POA: Diagnosis not present

## 2023-01-21 MED ORDER — METFORMIN HCL 500 MG PO TABS
1000.0000 mg | ORAL_TABLET | Freq: Two times a day (BID) | ORAL | 1 refills | Status: DC
Start: 2023-01-21 — End: 2023-06-06

## 2023-01-21 MED ORDER — ATORVASTATIN CALCIUM 10 MG PO TABS
10.0000 mg | ORAL_TABLET | Freq: Every day | ORAL | 3 refills | Status: DC
  Filled 2023-05-31: qty 90, 90d supply, fill #0

## 2023-01-21 MED ORDER — OZEMPIC (0.25 OR 0.5 MG/DOSE) 2 MG/3ML ~~LOC~~ SOPN
0.2500 mg | PEN_INJECTOR | SUBCUTANEOUS | 0 refills | Status: DC
Start: 2023-01-21 — End: 2023-02-09

## 2023-01-21 NOTE — Assessment & Plan Note (Signed)
 Remains adequately controlled on current antihypertensive regimen.  No medication changes are indicated today.

## 2023-01-21 NOTE — Assessment & Plan Note (Signed)
 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  Has pins in place.  Family reports today that they plan to follow-up with orthopedic surgery to discuss arthroplasty in 2025.

## 2023-01-21 NOTE — Assessment & Plan Note (Signed)
 A1c 7.2 on labs from May.  She is currently prescribed metformin  1000 mg twice daily.  She is focusing on lifestyle changes aimed at weight loss.  She has made dietary changes aimed at weight loss and lowering her blood sugar.  Exercise capacity is limited in the setting of bilateral hip OA.  Today she would like to discuss medication options for treatment of diabetes and weight loss.  We have previously discussed Ozempic  and she is interested in starting medication at this time. -Repeat A1c ordered today -Start Ozempic  0.25 mg weekly.  Uptitrate dose every 4 weeks to 2 mg weekly

## 2023-01-21 NOTE — Assessment & Plan Note (Signed)
 Her weight today is 264 pounds.  BMI 45.4.  She remains focused on lifestyle changes aimed at weight loss.  We are also starting Ozempic in the setting of diabetes mellitus. -Follow-up in 3 months for reassessment

## 2023-01-21 NOTE — Patient Instructions (Signed)
 It was a pleasure to see you today.  Thank you for giving us  the opportunity to be involved in your care.  Below is a brief recap of your visit and next steps.  We will plan to see you again in 3 months.  Summary Start Ozempic  today. Increase to 0.5 mg weekly after 4 injections of 0.25 mg  Repeat A1c Follow up in 3 months
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
## 2023-01-24 LAB — BAYER DCA HB A1C WAIVED: HB A1C (BAYER DCA - WAIVED): 6.1 % — ABNORMAL HIGH (ref 4.8–5.6)

## 2023-01-31 ENCOUNTER — Telehealth: Payer: Self-pay | Admitting: Internal Medicine

## 2023-01-31 ENCOUNTER — Encounter: Payer: Self-pay | Admitting: Adult Health

## 2023-01-31 ENCOUNTER — Ambulatory Visit: Payer: 59 | Admitting: Adult Health

## 2023-01-31 VITALS — BP 126/88 | HR 98 | Ht 64.0 in | Wt 271.0 lb

## 2023-01-31 DIAGNOSIS — Z1211 Encounter for screening for malignant neoplasm of colon: Secondary | ICD-10-CM

## 2023-01-31 DIAGNOSIS — Z1331 Encounter for screening for depression: Secondary | ICD-10-CM | POA: Diagnosis not present

## 2023-01-31 DIAGNOSIS — Z3042 Encounter for surveillance of injectable contraceptive: Secondary | ICD-10-CM | POA: Diagnosis not present

## 2023-01-31 DIAGNOSIS — Z0000 Encounter for general adult medical examination without abnormal findings: Secondary | ICD-10-CM | POA: Diagnosis not present

## 2023-01-31 DIAGNOSIS — Z01419 Encounter for gynecological examination (general) (routine) without abnormal findings: Secondary | ICD-10-CM | POA: Insufficient documentation

## 2023-01-31 LAB — HEMOCCULT GUIAC POC 1CARD (OFFICE): Fecal Occult Blood, POC: NEGATIVE

## 2023-01-31 MED ORDER — PREGABALIN 150 MG PO CAPS: 150.0000 mg | ORAL_CAPSULE | Freq: Two times a day (BID) | ORAL | 0 refills | Status: DC
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
## 2023-01-31 NOTE — Telephone Encounter (Signed)
 Prescription Request  01/31/2023  LOV: 01/21/2023  What is the name of the medication or equipment? pregabalin  (LYRICA ) 150 MG capsule [[id number]]    Have you contacted your pharmacy to request a refill? No   Which pharmacy would you like this sent to?  [facility] [address] [place] - [address] [place] [address] Phone: [phone] Fax: [phone]      Patient notified that their request is being sent to the clinical staff for review and that they should receive a response within 2 business days.   Please advise at [facility] [phone]

## 2023-02-08 ENCOUNTER — Encounter: Payer: Self-pay | Admitting: Internal Medicine

## 2023-02-09 ENCOUNTER — Telehealth: Payer: Self-pay | Admitting: Internal Medicine

## 2023-02-09 MED ORDER — OZEMPIC (0.25 OR 0.5 MG/DOSE) 2 MG/3ML ~~LOC~~ SOPN
0.2500 mg | PEN_INJECTOR | SUBCUTANEOUS | 0 refills | Status: DC
Start: 2023-02-09 — End: 2023-02-09

## 2023-02-09 MED ORDER — OZEMPIC (0.25 OR 0.5 MG/DOSE) 2 MG/3ML ~~LOC~~ SOPN: 0.5000 mg | PEN_INJECTOR | SUBCUTANEOUS | 0 refills | Status: AC

## 2023-02-09 NOTE — Telephone Encounter (Signed)
Copied from CRM [id number]. Topic: Clinical - Medical Advice >> Feb 09, 2023  2:36 PM [facility] R wrote: Reason for CRM: Patient was advised to call when she has taken her 3rd shot. Wants to know hwo to move forward. Also, if to change anything. Physician told patient to give call when time so can give instructions of what to do next.

## 2023-02-10 NOTE — Telephone Encounter (Signed)
Patient advised.

## 2023-03-02 ENCOUNTER — Other Ambulatory Visit: Payer: Self-pay | Admitting: Internal Medicine

## 2023-03-15 ENCOUNTER — Ambulatory Visit: Payer: 59 | Admitting: *Deleted

## 2023-03-15 DIAGNOSIS — Z3042 Encounter for surveillance of injectable contraceptive: Secondary | ICD-10-CM

## 2023-03-15 MED ORDER — MEDROXYPROGESTERONE ACETATE 150 MG/ML IM SUSY
150.0000 mg | PREFILLED_SYRINGE | Freq: Once | INTRAMUSCULAR | Status: AC
Start: 2023-03-15 — End: 2023-03-15
  Administered 2023-03-15: 150 mg via INTRAMUSCULAR

## 2023-03-15 NOTE — Progress Notes (Signed)
   NURSE VISIT- INJECTION  SUBJECTIVE:  Melinda Giles is a 53 y.o. G62P1001 female here for a Depo Provera for contraception/period management. She is a GYN patient.   OBJECTIVE:  There were no vitals taken for this visit.  Appears well, in no apparent distress  Injection administered in: Left deltoid  Meds ordered this encounter  Medications   medroxyPROGESTERone Acetate SUSY 150 mg    ASSESSMENT: GYN patient Depo Provera for contraception/period management PLAN: Follow-up: in 11-13 weeks for next Depo   Jobe Marker  03/15/2023 10:01 AM

## 2023-03-18 ENCOUNTER — Other Ambulatory Visit: Payer: Self-pay

## 2023-03-18 DIAGNOSIS — R7989 Other specified abnormal findings of blood chemistry: Secondary | ICD-10-CM

## 2023-03-21 ENCOUNTER — Inpatient Hospital Stay: Payer: 59 | Attending: Hematology

## 2023-03-21 DIAGNOSIS — Z793 Long term (current) use of hormonal contraceptives: Secondary | ICD-10-CM | POA: Diagnosis not present

## 2023-03-21 DIAGNOSIS — Z7984 Long term (current) use of oral hypoglycemic drugs: Secondary | ICD-10-CM | POA: Diagnosis not present

## 2023-03-21 DIAGNOSIS — R7989 Other specified abnormal findings of blood chemistry: Secondary | ICD-10-CM | POA: Diagnosis not present

## 2023-03-21 DIAGNOSIS — D72829 Elevated white blood cell count, unspecified: Secondary | ICD-10-CM | POA: Diagnosis not present

## 2023-03-21 DIAGNOSIS — Z79899 Other long term (current) drug therapy: Secondary | ICD-10-CM | POA: Insufficient documentation

## 2023-03-21 LAB — CBC WITH DIFFERENTIAL/PLATELET
Abs Immature Granulocytes: 0.06 10*3/uL (ref 0.00–0.07)
Basophils Absolute: 0.1 10*3/uL (ref 0.0–0.1)
Basophils Relative: 1 %
Eosinophils Absolute: 0.2 10*3/uL (ref 0.0–0.5)
Eosinophils Relative: 1 %
HCT: 38.2 % (ref 36.0–46.0)
Hemoglobin: 12.7 g/dL (ref 12.0–15.0)
Immature Granulocytes: 1 %
Lymphocytes Relative: 24 %
Lymphs Abs: 2.9 10*3/uL (ref 0.7–4.0)
MCH: 30.3 pg (ref 26.0–34.0)
MCHC: 33.2 g/dL (ref 30.0–36.0)
MCV: 91.2 fL (ref 80.0–100.0)
Monocytes Absolute: 0.7 10*3/uL (ref 0.1–1.0)
Monocytes Relative: 6 %
Neutro Abs: 8.4 10*3/uL — ABNORMAL HIGH (ref 1.7–7.7)
Neutrophils Relative %: 67 %
Platelets: 320 10*3/uL (ref 150–400)
RBC: 4.19 MIL/uL (ref 3.87–5.11)
RDW: 13.4 % (ref 11.5–15.5)
WBC: 12.3 10*3/uL — ABNORMAL HIGH (ref 4.0–10.5)
nRBC: 0 % (ref 0.0–0.2)

## 2023-03-21 LAB — IRON AND TIBC
Iron: 71 ug/dL (ref 28–170)
Saturation Ratios: 23 % (ref 10.4–31.8)
TIBC: 314 ug/dL (ref 250–450)
UIBC: 243 ug/dL

## 2023-03-21 LAB — FERRITIN: Ferritin: 232 ng/mL (ref 11–307)

## 2023-03-23 ENCOUNTER — Other Ambulatory Visit: Payer: 59

## 2023-03-28 ENCOUNTER — Inpatient Hospital Stay (HOSPITAL_BASED_OUTPATIENT_CLINIC_OR_DEPARTMENT_OTHER): Payer: 59 | Admitting: Oncology

## 2023-03-28 VITALS — BP 128/84 | HR 95 | Temp 98.3°F | Ht 64.0 in | Wt 268.6 lb

## 2023-03-28 DIAGNOSIS — D72829 Elevated white blood cell count, unspecified: Secondary | ICD-10-CM | POA: Insufficient documentation

## 2023-03-28 DIAGNOSIS — D72825 Bandemia: Secondary | ICD-10-CM | POA: Diagnosis not present

## 2023-03-28 DIAGNOSIS — R7989 Other specified abnormal findings of blood chemistry: Secondary | ICD-10-CM

## 2023-03-28 DIAGNOSIS — Z79899 Other long term (current) drug therapy: Secondary | ICD-10-CM | POA: Diagnosis not present

## 2023-03-28 DIAGNOSIS — Z7984 Long term (current) use of oral hypoglycemic drugs: Secondary | ICD-10-CM | POA: Diagnosis not present

## 2023-03-28 NOTE — Progress Notes (Signed)
 [facility] at [facility] HEMATOLOGY FOLLOW-UP VISIT  Melinda Lade, MD  REASON FOR FOLLOW-UP: Elevated ferritin and leukocytosis  ASSESSMENT & PLAN:  Patient is a 53 year old female with elevated ferritin levels and leukocytosis.    Leukocytosis Patient has persistent mild WBC elevation with neutrophil predominance.  No B symptoms at this time.  Denies any signs of infection including fevers and chills.  Discussed with patient that obesity can cause slight leukocytosis. -Continue to monitor WBC count. -Will obtain LDH, uric acid, flow cytometry with the next blood draw to rule out other causes of leukocytosis  Return to clinic in 3 months  Elevated ferritin Elevated ferritin likely secondary to inflammation.  Resolved at this time. -Continue to monitor levels.   Orders Placed This Encounter  Procedures   CBC with Differential/Platelet    Standing Status:   Future    Expected Date:   06/27/2023    Expiration Date:   03/27/2024   Comprehensive metabolic panel    Standing Status:   Future    Expected Date:   06/27/2023    Expiration Date:   03/27/2024   Ferritin    Standing Status:   Future    Expected Date:   06/27/2023    Expiration Date:   03/27/2024   Lactate dehydrogenase    Standing Status:   Future    Expected Date:   06/27/2023    Expiration Date:   03/27/2024   Uric acid    Standing Status:   Future    Expected Date:   06/27/2023    Expiration Date:   03/27/2024   Sedimentation rate    Standing Status:   Future    Expected Date:   06/27/2023    Expiration Date:   03/27/2024   C-reactive protein    Standing Status:   Future    Expected Date:   06/27/2023    Expiration Date:   03/27/2024   Flow Cytometry, Peripheral Blood (Oncology)    Standing Status:   Future    Expected Date:   06/27/2023    Expiration Date:   03/27/2024    The total time spent in the appointment was 20 minutes encounter with patients including review of chart and various tests  results, discussions about plan of care and coordination of care plan   All questions were answered. The patient knows to call the clinic with any problems, questions or concerns. No barriers to learning was detected.  Melinda Crumbly, MD 3/10/20253:48 PM   INTERVAL HISTORY: Melinda Giles 53 y.o. female following for elevated ferritin and leukocytosis.  She has no complaints today. She denies fever, chills, night sweats, unintentional weight loss, and fatigue. She is currently trying to lose weight with Ozempic. She also reports a recent episode of vomiting and diarrhea, which has since resolved.  I have reviewed the past medical history, past surgical history, social history and family history with the patient   ALLERGIES:  is allergic to lisinopril and other.  MEDICATIONS:  Current Outpatient Medications  Medication Sig Dispense Refill   atorvastatin (LIPITOR) 10 MG tablet Take 1 tablet (10 mg total) by mouth daily. 90 tablet 3   Blood Glucose Monitoring Suppl DEVI 1 each by Does not apply route in the morning, at noon, and at bedtime. May substitute to any manufacturer covered by patient's insurance. 1 each 0   Cholecalciferol (VITAMIN D-3) 25 MCG (1000 UT) CAPS Take by mouth.     Continuous Glucose Receiver (  FREESTYLE LIBRE 3 READER) DEVI 1 Device by Does not apply route continuous. 1 each 0   Continuous Glucose Sensor (FREESTYLE LIBRE 3 SENSOR) MISC 1 Device by Does not apply route every 14 (fourteen) days. 1 each 3   losartan (COZAAR) 25 MG tablet Take 1 tablet (25 mg total) by mouth daily. 90 tablet 1   medroxyPROGESTERone (DEPO-PROVERA) 150 MG/ML injection Inject 1 mL (150 mg total) into the muscle every 3 (three) months. 1 mL 3   metFORMIN (GLUCOPHAGE) 500 MG tablet Take 2 tablets (1,000 mg total) by mouth 2 (two) times daily with a meal. 360 tablet 1   metoprolol succinate (TOPROL-XL) 25 MG 24 hr tablet Take 1 tablet (25 mg total) by mouth daily. 90 tablet 3   OZEMPIC, 0.25 OR  0.5 MG/DOSE, 2 MG/3ML SOPN SMARTSIG:0.25 Milligram(s) SUB-Q Once a Week     pregabalin (LYRICA) 150 MG capsule Take 1 capsule by mouth twice daily 60 capsule 0   No current facility-administered medications for this visit.     REVIEW OF SYSTEMS:   Constitutional: Denies fevers, chills or night sweats Eyes: Denies blurriness of vision Ears, nose, mouth, throat, and face: Denies mucositis or sore throat Respiratory: Denies cough, dyspnea or wheezes Cardiovascular: Denies palpitation, chest discomfort or lower extremity swelling Gastrointestinal:  Denies nausea, heartburn or change in bowel habits Skin: Denies abnormal skin rashes Lymphatics: Denies new lymphadenopathy or easy bruising Neurological:Denies numbness, tingling or new weaknesses Behavioral/Psych: Mood is stable, no new changes  All other systems were reviewed with the patient and are negative.  PHYSICAL EXAMINATION:   Vitals:   03/28/23 0922  BP: 128/84  Pulse: 95  Temp: 98.3 F (36.8 C)  SpO2: 97%    GENERAL:alert, no distress and comfortable LYMPH:  no palpable lymphadenopathy in the cervical, axillary or inguinal LUNGS: clear to auscultation and percussion with normal breathing effort HEART: regular rate & rhythm and no murmurs and no lower extremity edema ABDOMEN:abdomen soft, non-tender and normal bowel sounds Musculoskeletal:no cyanosis of digits and no clubbing  NEURO: alert & oriented x 3 with fluent speech  LABORATORY DATA:  I have reviewed the data as listed  Latest Reference Range & Units 03/21/23 09:34  WBC 4.0 - 10.5 K/uL 12.3 (H)  RBC 3.87 - 5.11 MIL/uL 4.19  Hemoglobin 12.0 - 15.0 g/dL 30.8  HCT 65.7 - 84.6 % 38.2  MCV 80.0 - 100.0 fL 91.2  MCH 26.0 - 34.0 pg 30.3  MCHC 30.0 - 36.0 g/dL 96.2  RDW 95.2 - 84.1 % 13.4  Platelets 150 - 400 K/uL 320  nRBC 0.0 - 0.2 % 0.0  Neutrophils % 67  Lymphocytes % 24  Monocytes Relative % 6  Eosinophil % 1  Basophil % 1  Immature Granulocytes % 1   NEUT# 1.7 - 7.7 K/uL 8.4 (H)  Lymphs Abs 0.7 - 4.0 K/uL 2.9  Monocyte # 0.1 - 1.0 K/uL 0.7  Eosinophils Absolute 0.0 - 0.5 K/uL 0.2  Basophils Absolute 0.0 - 0.1 K/uL 0.1  Abs Immature Granulocytes 0.00 - 0.07 K/uL 0.06  (H): Data is abnormally high      Chemistry      Component Value Date/Time   NA 134 (L) 09/29/2022 0835   NA 141 06/01/2022 1102   K 3.6 09/29/2022 0835   CL 102 09/29/2022 0835   CO2 24 09/29/2022 0835   BUN 10 09/29/2022 0835   BUN 9 06/01/2022 1102   CREATININE 0.72 09/29/2022 0835      Component  Value Date/Time   CALCIUM 8.6 (L) 09/29/2022 0835   ALKPHOS 49 09/29/2022 0835   AST 15 09/29/2022 0835   ALT 16 09/29/2022 0835   BILITOT 0.8 09/29/2022 0835   BILITOT 0.9 06/01/2022 1102      Latest Reference Range & Units 03/21/23 09:34  Iron 28 - 170 ug/dL 71  UIBC ug/dL 161  TIBC 096 - 045 ug/dL 409  Saturation Ratios 10.4 - 31.8 % 23  Ferritin 11 - 307 ng/mL 232

## 2023-03-28 NOTE — Patient Instructions (Signed)
 During today's visit, we reviewed your recent health concerns, including your elevated white blood cell count, iron levels, recent episode of gastroenteritis, and weight management. Your ferritin levels have normalized, and your recent gastrointestinal symptoms have resolved.  YOUR PLAN:  -ELEVATED WHITE BLOOD CELL COUNT: Your white blood cell count remains slightly elevated, which can sometimes happen in individuals who are overweight. Since you do not have any symptoms of infection, inflammation, or malignancy, we will continue to monitor your white blood cell count and order additional labs at your next visit to rule out any serious conditions.  -IRON LEVELS: Your iron levels, which were previously elevated, have now returned to normal. There are no current symptoms of iron overload, and we will continue to monitor your iron levels to ensure they remain stable.   INSTRUCTIONS:  Please follow up in 3 months for additional labs and a review of your white blood cell count and weight management progress.

## 2023-03-28 NOTE — Assessment & Plan Note (Signed)
 Patient has persistent mild WBC elevation with neutrophil predominance.  No B symptoms at this time.  Denies any signs of infection including fevers and chills.  Discussed with patient that obesity can cause slight leukocytosis. -Continue to monitor WBC count. -Will obtain LDH, uric acid, flow cytometry with the next blood draw to rule out other causes of leukocytosis  Return to clinic in 3 months

## 2023-03-28 NOTE — Assessment & Plan Note (Signed)
 Elevated ferritin likely secondary to inflammation.  Resolved at this time. -Continue to monitor levels.

## 2023-03-30 ENCOUNTER — Other Ambulatory Visit: Payer: Self-pay | Admitting: Internal Medicine

## 2023-03-30 ENCOUNTER — Ambulatory Visit: Payer: 59 | Admitting: Oncology

## 2023-04-01 ENCOUNTER — Other Ambulatory Visit: Payer: Self-pay | Admitting: Internal Medicine

## 2023-04-19 ENCOUNTER — Other Ambulatory Visit (HOSPITAL_COMMUNITY): Payer: Self-pay | Admitting: Internal Medicine

## 2023-04-19 DIAGNOSIS — Z1231 Encounter for screening mammogram for malignant neoplasm of breast: Secondary | ICD-10-CM

## 2023-04-21 ENCOUNTER — Ambulatory Visit: Payer: 59 | Admitting: Internal Medicine

## 2023-04-21 ENCOUNTER — Encounter: Payer: Self-pay | Admitting: Internal Medicine

## 2023-04-21 VITALS — BP 117/79 | HR 97 | Ht 65.0 in | Wt 264.8 lb

## 2023-04-21 DIAGNOSIS — Z6841 Body Mass Index (BMI) 40.0 and over, adult: Secondary | ICD-10-CM

## 2023-04-21 DIAGNOSIS — E119 Type 2 diabetes mellitus without complications: Secondary | ICD-10-CM | POA: Diagnosis not present

## 2023-04-21 DIAGNOSIS — Z7985 Long-term (current) use of injectable non-insulin antidiabetic drugs: Secondary | ICD-10-CM

## 2023-04-21 DIAGNOSIS — M160 Bilateral primary osteoarthritis of hip: Secondary | ICD-10-CM | POA: Diagnosis not present

## 2023-04-21 DIAGNOSIS — R269 Unspecified abnormalities of gait and mobility: Secondary | ICD-10-CM | POA: Diagnosis not present

## 2023-04-21 MED ORDER — SEMAGLUTIDE (1 MG/DOSE) 4 MG/3ML ~~LOC~~ SOPN: 1.0000 mg | PEN_INJECTOR | SUBCUTANEOUS | 0 refills | Status: DC

## 2023-04-21 NOTE — Assessment & Plan Note (Addendum)
 A1c 6.1 on labs from January.  Ozempic was started at that time.  She has not experienced any adverse side effects.  She is additionally prescribed metformin 1000 mg twice daily.  Her current weight is 264 pounds, which is the same as her weight at her appointment in January. -Increase Ozempic to 1 mg weekly -Repeat A1c at follow-up in 3 months -Podiatry referral placed today for diabetic footcare

## 2023-04-21 NOTE — Assessment & Plan Note (Signed)
 Known history of bilateral hip OA, further complicated by a history of osteonecrosis of both hips.  She has pins in place.  She currently ambulates with a cane but is interested in obtaining a rolling walker to improve ambulation. -Orders for a rolling walker were placed today
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
## 2023-04-21 NOTE — Patient Instructions (Signed)
 It was a pleasure to see you today.  Thank you for giving [place] the opportunity to be involved in your care.  Below is a brief recap of your visit and next steps.  We will plan to see you again in 3 months.  Summary Increase Ozempic to 1 mg weekly Referral placed to podiatry Rolling walker ordered Follow up in 3 months

## 2023-04-27 ENCOUNTER — Encounter: Payer: Self-pay | Admitting: Podiatrist

## 2023-04-27 ENCOUNTER — Ambulatory Visit (INDEPENDENT_AMBULATORY_CARE_PROVIDER_SITE_OTHER): Admitting: Podiatrist

## 2023-04-27 VITALS — Ht 65.0 in | Wt 264.8 lb

## 2023-04-27 DIAGNOSIS — L600 Ingrowing nail: Secondary | ICD-10-CM | POA: Diagnosis not present

## 2023-04-27 MED ORDER — MUPIROCIN 2 % EX OINT
1.0000 | TOPICAL_OINTMENT | Freq: Two times a day (BID) | CUTANEOUS | 1 refills | Status: DC
Start: 2023-04-27 — End: 2023-07-28
  Filled 2023-05-31: qty 22, 11d supply, fill #0

## 2023-04-27 NOTE — Progress Notes (Signed)
 Chief Complaint  Patient presents with   Nail Problem    " I noticed some white discharge coming out of the nail about 2 weeks ago and I have only been using peroxide on the right big toe and nothing else and I was concerned about a possible infection"      HPI: Patient is 53 y.o. female who presents today for pain medial side of the right hallux nail.  She had pus from the nail about 2 weeks ago after the nail was trimmed back.  No active pus noted at todays visit.  She presents today with her Aunt.  She relates her blood sugar is under good control    Patient Active Problem List   Diagnosis Date Noted   Leukocytosis 03/28/2023   Encounter for screening fecal occult blood testing 01/31/2023   Encounter for well woman exam with routine gynecological exam 01/31/2023   Depo-Provera contraceptive status 01/31/2023   Morbid obesity (HCC) 01/21/2023   Need for influenza vaccination 10/19/2022   Elevated ferritin 09/22/2022   Essential hypertension 06/01/2022   Bilateral hip joint arthritis 06/01/2022   Type 2 diabetes mellitus (HCC) 06/01/2022   Hyperlipidemia associated with type 2 diabetes mellitus (HCC) 06/01/2022   Cognitive impairment 06/01/2022   Former tobacco use 06/01/2022   Colon cancer screening 06/01/2022   Unilateral primary osteoarthritis, left hip 03/16/2021   Unilateral primary osteoarthritis, right hip 03/16/2021   Retained orthopedic hardware 03/16/2021   Schizophrenia (HCC)    Cirrhosis of liver (HCC) 12/24/2020   Altered mental status 11/09/2020   Seizure (HCC) 11/09/2020   Vitamin D deficiency 08/29/2019   Anemia 07/18/2019   Abnormal gait 05/30/2019   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 90 tablet 3   Blood  Glucose Monitoring Suppl DEVI 1 each by Does not apply route in the morning, at noon, and at bedtime. May substitute to any manufacturer covered by patient's insurance. 1 each 0   Cholecalciferol (VITAMIN D-3) 25 MCG (1000 UT) CAPS Take by mouth.     Continuous Glucose Receiver (FREESTYLE LIBRE 3 READER) DEVI 1 Device by Does not apply route continuous. 1 each 0   Continuous Glucose Sensor (FREESTYLE LIBRE 3 SENSOR) MISC 1 Device by Does not apply route every 14 (fourteen) days. 1 each 3   losartan (COZAAR) 25 MG tablet Take 1 tablet (25 mg total) by mouth daily. 90 tablet 1   medroxyPROGESTERone (DEPO-PROVERA) 150 MG/ML injection Inject 1 mL (150 mg total) into the muscle every 3 (three) months. 1 mL 3   metFORMIN (GLUCOPHAGE) 500 MG tablet Take 2 tablets (1,000 mg total) by mouth 2 (two) times daily with a meal. 360 tablet 1   metoprolol succinate (TOPROL-XL) 25 MG 24 hr tablet Take 1 tablet (25 mg total) by mouth daily. 90 tablet 3   pregabalin (LYRICA) 150 MG capsule Take 1 capsule by mouth twice daily 60 capsule 0   Semaglutide, 1 MG/DOSE, 4 MG/3ML SOPN Inject 1 mg as directed once a week for 28 days. 3 mL 0   No current facility-administered medications on file prior to visit.    Allergies  Allergen Reactions   Lisinopril Anaphylaxis   Other Anaphylaxis and Swelling    "pril " family of bp  meds   Lab Results  Component Value Date   HGBA1C 6.1 (H) 01/21/2023   HGBA1C 7.2 (H) 06/01/2022    Review of Systems No fevers, chills, nausea, muscle aches, no difficulty breathing, no calf pain, no chest pain or shortness of breath.   Physical Exam  GENERAL APPEARANCE: Alert, conversant. Appropriately groomed. No acute distress.   VASCULAR: Pedal pulses palpable 2/4 DP and 2/4 PT bilateral.  Capillary refill time is immediate to all digits,  Proximal to distal cooling is warm to warm.  Digital perfusion adequate.   NEUROLOGIC: sensation is intact to 5.07 monofilament at 5/5 sites  bilateral.  Light touch is intact bilateral, vibratory sensation intact bilateral  MUSCULOSKELETAL: acceptable muscle strength, tone and stability bilateral.  No gross boney pedal deformities noted.  No pain, crepitus or limitation noted with foot and ankle range of motion bilateral.   DERMATOLOGIC: skin is warm, supple, and dry.  Right hallux nail is incurvated on the medial border.  Pain with direct pressure is noted.  No redness, no swelling, no active pus is identified.   Assessment    ICD-10-CM   1. Ingrown right greater toenail  L60.0        Plan  Treatment options and alternatives were discussed. Recommended a permanent removal of the medial nail border of the right hallux patient agreed. Skin was prepped with alcohol and a local injection of lidocaine and Marcaine plain was infiltrated to anesthetize the toe. The toe was then prepped with Betadine and exsanguinated. The offending nail border was removed and phenol applied to the exposed matrix tissue.  The area was then cleansed well with alcohol.  Antibiotic ointment and a dressing was then applied the tourniquet released  noting a prompt hyperemic response to the tip of the toe.  Oral and written instructions were dispensed and the patient was instructed on aftercare.  If there is any increased redness, swelling, drainage, pus or any other concerns arise, Adair Laundry will call to be seen.  Otherwise she will return in 5-10 days for a nail check.

## 2023-04-27 NOTE — Patient Instructions (Signed)
Soak Instructions    THE DAY AFTER THE PROCEDURE  Place 1/4 cup of epsom salts in a quart of warm tap water.  Submerge your foot or feet with outer bandage intact for the initial soak; this will allow the bandage to become moist and wet for easy lift off.  Once you remove your bandage, continue to soak in the solution for 20 minutes.  .  Next, remove your foot or feet from solution, blot dry the affected area and cover.  Apply mupirocin ointment (RX), or polysporin   You may use a band aid large enough to cover the area or use gauze and tape.    **This soak should be done twice a day for the next 5-7 days.- after that time you may switch to keeping it clean with soap and water in the shower.  You may discontinue use of the bandaid at night after about 2 weeks.  You may discontinue use of the bandaid during the day in shoes when there is no drainage on the bandaid.   IF YOUR SKIN BECOMES IRRITATED WHILE USING THESE INSTRUCTIONS, IT IS OKAY TO SWITCH TO  antibacterial soap pump soap (Dial)  and water to keep the toe clean instead of soaking in epsom salts.      Long Term Care Instructions-Post Nail Surgery  You have had your ingrown toenail and root treated with a chemical.  This chemical causes a burn that will drain and ooze like a blister.  1-2 weeks after the procedure you may leave the area open to air at night to help dry it up.  During the day,  It is important to keep this area clean and covered until the toe dries out and forms a scab. Once the scab forms you no longer need to soak or apply a dressing.  If at any time you experience an increase in pain, redness, swelling, or drainage, you should contact the office as soon as possible.  

## 2023-05-03 ENCOUNTER — Ambulatory Visit (INDEPENDENT_AMBULATORY_CARE_PROVIDER_SITE_OTHER): Payer: 59

## 2023-05-03 ENCOUNTER — Telehealth: Payer: Self-pay | Admitting: *Deleted

## 2023-05-03 VITALS — Ht 65.0 in | Wt 262.0 lb

## 2023-05-03 DIAGNOSIS — Z0000 Encounter for general adult medical examination without abnormal findings: Secondary | ICD-10-CM | POA: Diagnosis not present

## 2023-05-03 DIAGNOSIS — Z7409 Other reduced mobility: Secondary | ICD-10-CM

## 2023-05-03 DIAGNOSIS — Z7189 Other specified counseling: Secondary | ICD-10-CM

## 2023-05-03 DIAGNOSIS — Z1211 Encounter for screening for malignant neoplasm of colon: Secondary | ICD-10-CM

## 2023-05-03 DIAGNOSIS — E119 Type 2 diabetes mellitus without complications: Secondary | ICD-10-CM

## 2023-05-03 DIAGNOSIS — Z79899 Other long term (current) drug therapy: Secondary | ICD-10-CM

## 2023-05-03 NOTE — Patient Instructions (Addendum)
 Melinda Giles , Thank you for taking time to come for your Medicare Wellness Visit. I appreciate your ongoing commitment to your health goals. Please review the following plan we discussed and let me know if I can assist you in the future.   Referrals/Orders/Follow-Ups/Clinician Recommendations:  Next Medicare AWV: May 07, 2024 at 8:40 am telephone visit.  You have been referred to [facility].If you haven't heard from them within the next week, please call them to schedule your appointment.    Address: [address], [place], [place] [address] Phone: [phone]  A referral has been placed for you to check and see what additional resources are available to you.   If you haven't heard from anyone within the next 7 business days, please call them and let them know a referral has been placed  Phone: [phone]  Please have the following labs drawn at [facility] at [facility]. You do not have to schedule an appointment for this.  Diabetic urine screen AFTER May 7th 2025  You are due for the vaccines checked below. You may have these done at your preferred pharmacy. Please have them fax the office proof of the vaccines so that we can update your chart.   []  Flu (due annually)  Recommended this fall either at PCP office or through your local pharmacy. The flu season starts August 1 of each year.   [x]  Shingrix (Shingles vaccine): CDC recommends 2 doses of Shingrix separated by 2-6 months for aged 9 years and older:  [x]  Pneumonia Vaccines: Recommended for adults 65 years or older  [x]  TDAP (Tetanus) Vaccine every 10 years:Recommended every 10 years; Please call your insurance company to determine your out of pocket expense. You also receive this vaccine at your local pharmacy or Health Dept.  [x]  Covid-19: Available now at any [facility] pharmacy (see info below)  You may also get your vaccines at any [facility] (locations listed below.) Vaccine  hours are Monday - Friday 9:00 - 4:00. No appointments are required. Most insurances are accepted including Medicaid. Anyone can use the community pharmacies, and people are not required to have a [facility] provider.  Community Pharmacy Locations offering vaccines:   Sport and exercise psychologist   [facility]  10 vaccines are offered at the [employer] [facility]: Covid, flu, Tdap, shingles, RSV, pneumonia, meningococcal, hepatitis A, hepatitis B, and HPV.     This is a list of the screening recommended for you and due dates:  Health Maintenance  Topic Date Due   COVID-19 Vaccine (1) Never done   DTaP/Tdap/Td vaccine (1 - Tdap) Never done   Pneumococcal Vaccination (1 of 2 - PCV) Never done   Zoster (Shingles) Vaccine (1 of 2) Never done   Mammogram  04/30/2023   Yearly kidney health urinalysis for diabetes  06/01/2023   Complete foot exam   07/01/2023   Hemoglobin A1C  07/21/2023   Flu Shot  08/19/2023   Yearly kidney function blood test for diabetes  09/29/2023   Colon Cancer Screening  10/04/2023   Screening for Lung Cancer  11/09/2023   Eye exam for diabetics  12/01/2023   Medicare Annual Wellness Visit  05/02/2024   Pap with HPV screening  04/30/2026   Hepatitis C Screening  Completed   HIV Screening  Completed   HPV Vaccine  Aged Out   Meningitis B Vaccine  Aged Out  Advanced directives: (Provided) Advance directive discussed with you today. I have provided a copy for you to complete at home and have notarized. Once this is complete, please bring a copy in to our office so we can scan it into your chart.  Advance Care Planning is important because it:  [x]  Makes sure you receive the medical care that is consistent with your values, goals, and preferences  [x]  It provides guidance to your family and loved ones and it also reduces their decisional burden about whether or not they are  making the right decisions based on what you want done  Follow the link provided in your after visit summary or read over the paperwork we have mailed to you to help you started getting your Advance Directives in place. If you need assistance in completing these, please reach out to [place] so that we can help you!   Next Medicare Annual Wellness Visit scheduled for next year: yes  Understanding Your Risk for Falls Millions of people have serious injuries from falls each year. It is important to understand your risk of falling. Talk with your health care provider about your risk and what you can do to lower it. If you do have a serious fall, make sure to tell your provider. Falling once raises your risk of falling again. How can falls affect me? Serious injuries from falls are common. These include: Broken bones, such as hip fractures. Head injuries, such as traumatic brain injuries (TBI) or concussions. A fear of falling can cause you to avoid activities and stay at home. This can make your muscles weaker and raise your risk for a fall. What can increase my risk? There are a number of risk factors that increase your risk for falling. The more risk factors you have, the higher your risk of falling. Serious injuries from a fall happen most often to people who are older than 53 years old. Teenagers and young adults ages 78-29 are also at higher risk. Common risk factors include: Weakness in the lower body. Being generally weak or confused due to long-term (chronic) illness. Dizziness or balance problems. Poor vision. Medicines that cause dizziness or drowsiness. These may include: Medicines for your blood pressure, heart, anxiety, insomnia, or swelling (edema). Pain medicines. Muscle relaxants. Other risk factors include: Drinking alcohol. Having had a fall in the past. Having foot pain or wearing improper footwear. Working at a dangerous job. Having any of the following in your  home: Tripping hazards, such as floor clutter or loose rugs. Poor lighting. Pets. Having dementia or memory loss. What actions can I take to lower my risk of falling?     Physical activity Stay physically fit. Do strength and balance exercises. Consider taking a regular class to build strength and balance. Yoga and tai chi are good options. Vision Have your eyes checked every year and your prescription for glasses or contacts updated as needed. Shoes and walking aids Wear non-skid shoes. Wear shoes that have rubber soles and low heels. Do not wear high heels. Do not walk around the house in socks or slippers. Use a cane or walker as told by your provider. Home safety Attach secure railings on both sides of your stairs. Install grab bars for your bathtub, shower, and toilet. Use a non-skid mat in your bathtub or shower. Attach bath mats securely with double-sided, non-slip rug tape. Use good lighting in all rooms. Keep a flashlight near your bed. Make sure there is a clear path from your  bed to the bathroom. Use night-lights. Do not use throw rugs. Make sure all carpeting is taped or tacked down securely. Remove all clutter from walkways and stairways, including extension cords. Repair uneven or broken steps and floors. Avoid walking on icy or slippery surfaces. Walk on the grass instead of on icy or slick sidewalks. Use ice melter to get rid of ice on walkways in the winter. Use a cordless phone. Questions to ask your health care provider Can you help me check my risk for a fall? Do any of my medicines make me more likely to fall? Should I take a vitamin D supplement? What exercises can I do to improve my strength and balance? Should I make an appointment to have my vision checked? Do I need a bone density test to check for weak bones (osteoporosis)? Would it help to use a cane or a walker? Where to find more information Centers for Disease Control and Prevention, STEAD[employer]:  TonerPromos.no Community-Based Fall Prevention Programs: TonerPromos.no [employer] on Aging: BaseRingTones.pl Contact a health care provider if: You fall at home. You are afraid of falling at home. You feel weak, drowsy, or dizzy. This information is not intended to replace advice given to you by your health care provider. Make sure you discuss any questions you have with your health care provider. Document Revised: 09/07/2021 Document Reviewed: 09/07/2021 Elsevier Patient Education  2024 [employer].
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
## 2023-05-03 NOTE — Progress Notes (Signed)
 Complex Care Management Note Care Guide Note  05/03/2023 Name: Melinda Giles MRN: [id number] DOB: [birth year]   Complex Care Management Outreach Attempts: An unsuccessful telephone outreach was attempted today to offer the patient information about available complex care management services.  Follow Up Plan:  Additional outreach attempts will be made to offer the patient complex care management information and services.   Encounter Outcome:  No Answer  Barnie Bora  [facility]  [facility], [facility] Guide  Direct Dial: [phone]  Fax [phone]

## 2023-05-05 ENCOUNTER — Ambulatory Visit (HOSPITAL_COMMUNITY)
Admission: RE | Admit: 2023-05-05 | Discharge: 2023-05-05 | Disposition: A | Discharge status: Home / Self Care | Source: Ambulatory Visit | Attending: Internal Medicine | Admitting: Internal Medicine

## 2023-05-05 ENCOUNTER — Encounter (HOSPITAL_COMMUNITY): Payer: Self-pay

## 2023-05-05 DIAGNOSIS — Z1231 Encounter for screening mammogram for malignant neoplasm of breast: Secondary | ICD-10-CM | POA: Insufficient documentation

## 2023-05-06 NOTE — Progress Notes (Signed)
 Complex Care Management Note  Care Guide Note 05/06/2023 Name: ANGELLA MONTAS MRN: [id number] DOB: [birth year]  Launa Police is a 53 y.o. year old female who sees Kermit Ped, Heath Litten, MD for primary care. I reached out to Launa Police by phone today to offer complex care management services.  Ms. Lopata and her Aunt Delois Andree Kayser was given information about Complex Care Management services today including:   The Complex Care Management services include support from the care team which includes your Nurse Care Manager, Clinical Social Worker, or Pharmacist.  The Complex Care Management team is here to help remove barriers to the health concerns and goals most important to you. Complex Care Management services are voluntary, and the patient may decline or stop services at any time by request to their care team member.   Complex Care Management Consent Status: Patient and Aunt Delois Andree Kayser agreed to services and verbal consent obtained.   Follow up plan:  Telephone appointment with complex care management team member scheduled for:  05/24/23  Encounter Outcome:  Patient Scheduled  Barnie Bora  [facility]  [facility], [facility] Guide  Direct Dial: [phone]  Fax [phone]

## 2023-05-09 ENCOUNTER — Other Ambulatory Visit: Payer: Self-pay | Admitting: Internal Medicine

## 2023-05-11 ENCOUNTER — Ambulatory Visit (INDEPENDENT_AMBULATORY_CARE_PROVIDER_SITE_OTHER): Admitting: Podiatrist

## 2023-05-11 ENCOUNTER — Encounter: Payer: Self-pay | Admitting: Podiatrist

## 2023-05-11 DIAGNOSIS — M79675 Pain in left toe(s): Secondary | ICD-10-CM | POA: Diagnosis not present

## 2023-05-11 DIAGNOSIS — L603 Nail dystrophy: Secondary | ICD-10-CM | POA: Diagnosis not present

## 2023-05-11 DIAGNOSIS — B351 Tinea unguium: Secondary | ICD-10-CM | POA: Diagnosis not present

## 2023-05-11 DIAGNOSIS — M79674 Pain in right toe(s): Secondary | ICD-10-CM | POA: Diagnosis not present

## 2023-05-11 NOTE — Progress Notes (Signed)
 Chief Complaint  Patient presents with   Ingrown Toenail    Nail check - hallux right   "Its healed, feels fine"   Debridement    Requesting toenail trim     HPI: Patient is 53 y.o. female who presents today for nail check right hallux medial nail border she states it is healed and its feels fine.  Nuys any redness swelling or drainage.  Overall states she is doing well.  She would also like the remainder of the nails trimmed at today's visit as it is difficult her for her to trim them herself. Allergies  Allergen Reactions   Lisinopril Anaphylaxis   Other Anaphylaxis and Swelling    "pril " family of bp meds    Review of systems is negative except as noted in the HPI.  Denies nausea/ vomiting/ fevers/ chills or night sweats.   Denies difficulty breathing, denies calf pain or tenderness  Physical Exam  Patient is awake, alert, and oriented x 3.  In no acute distress.    Vascular status is intact with palpable pedal pulses DP and PT bilateral and capillary refill time less than 3 seconds bilateral.  No edema or erythema noted.   Neurological exam reveals epicritic and protective sensation grossly intact bilateral.   Dermatological exam reveals skin is supple and dry to bilateral feet.  On appearance of the right hallux nail is noted status post ingrown nail border removal.  Remainder of the nails are thick, discolored, dystrophic and clinically mycotic several are incurvated as well.   Assessment:   ICD-10-CM   1. Nail dystrophy  L60.3     2. Pain due to onychomycosis of toenails of both feet  B35.1    M79.674    M79.675        Plan: Excellent appearance status post partial permanent matricectomy right hallux nail.  Remainder the nails are thickened and elongated.  I trimmed them today with a sterile nail nipper and power bur without complication.  She will be seen back in the future as needed for follow-up and will call if any problems or concerns arise.

## 2023-05-16 ENCOUNTER — Other Ambulatory Visit: Payer: Self-pay | Admitting: Internal Medicine

## 2023-05-16 DIAGNOSIS — E119 Type 2 diabetes mellitus without complications: Secondary | ICD-10-CM

## 2023-05-19 ENCOUNTER — Encounter: Payer: Self-pay | Admitting: *Deleted

## 2023-05-24 ENCOUNTER — Other Ambulatory Visit: Payer: Self-pay | Admitting: *Deleted

## 2023-05-24 ENCOUNTER — Encounter: Payer: Self-pay | Admitting: *Deleted

## 2023-05-24 ENCOUNTER — Other Ambulatory Visit: Payer: Self-pay

## 2023-05-24 DIAGNOSIS — E118 Type 2 diabetes mellitus with unspecified complications: Secondary | ICD-10-CM
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
## 2023-05-24 NOTE — Patient Instructions (Signed)
 Visit Information  Thank you for taking time to visit with me today. Please don't hesitate to contact me if I can be of assistance to you before our next scheduled appointment.  Our next appointment is by telephone on 06-07-2023 at 11:45 am Please call the care guide team at [phone] if you need to cancel or reschedule your appointment.   Following is a copy of your care plan:   Goals Addressed             This Visit's Progress    VBCI RN Care Plan: DM   On track    Problems:  Chronic Disease Management support and education needs related to DMII  Goal: Over the next 90 days the Caregiver will attend all scheduled medical appointments: with primary care provider and specialist as evidenced by keeping all scheduled appointments        continue to work with RN Care Manager and/or Social Worker to address care management and care coordination needs related to DMII as evidenced by adherence to care management team scheduled appointments     demonstrate Improved and Ongoing adherence to prescribed treatment plan for DMII as evidenced by verbal explanation, recognizing symptoms, lifestyle modifications  Interventions:   Diabetes Interventions: Assessed patient's understanding of A1c goal: <6.5% Provided education to patient about basic DM disease process Reviewed medications with patient and discussed importance of medication adherence Counseled on importance of regular laboratory monitoring as prescribed Discussed plans with patient for ongoing care management follow up and provided patient with direct contact information for care management team Provided patient with written educational materials related to hypo and hyperglycemia and importance of correct treatment Reviewed scheduled/upcoming provider appointments including: 07-28-2023 with PCP Advised patient, providing education and rationale, to check cbg fasting daily and record, calling provider for findings outside established  parameters Referral made to pharmacy team for assistance with pre package medications from pharmacy to assist in adherence/scheduling Review of patient status, including review of consultants reports, relevant laboratory and other test results, and medications completed Screening for signs and symptoms of depression related to chronic disease state  Assessed social determinant of health barriers Lab Results  Component Value Date   HGBA1C 6.1 (H) 01/21/2023    Patient Self-Care Activities:  Attend all scheduled provider appointments Call pharmacy for medication refills 3-7 days in advance of running out of medications Call provider office for new concerns or questions  Perform all self care activities independently  Take medications as prescribed   keep appointment with eye doctor check blood sugar at prescribed times: once daily and when you have symptoms of low or high blood sugar check feet daily for cuts, sores or redness fill half of plate with vegetables limit fast food meals to no more than 1 per week manage portion size set a realistic goal switch to sugar-free drinks keep feet up while sitting  Plan:  Telephone follow up appointment with care management team member scheduled for:  06-07-2023 at 11:45 am             Please call the Suicide and Crisis Lifeline: 988 call the [place]  National Suicide Prevention Lifeline: 336-636-0892 or TTY: 641-871-5356 TTY 364-605-1391) to talk to a trained counselor call 1-800-273-TALK (toll free, 24 hour hotline) call the [facility]: 445-372-1535 call 911 if you are experiencing a Mental Health or Behavioral Health Crisis or need someone to talk to.  Patient verbalizes understanding of instructions and care plan provided today and agrees to view in  MyChart. Active MyChart status and patient understanding of how to access instructions and care plan via MyChart confirmed with patient.     Grandville Lax, BSN  RN [facility], [facility] RN Care Manager Direct Dial: [phone]  Fax: [phone]   Carbohydrate Counting for Diabetes Mellitus, Adult Carbohydrate counting is a method of keeping track of how many carbohydrates you eat. Eating carbohydrates increases the amount of sugar (glucose) in the blood. Counting how many carbohydrates you eat improves how well you manage your blood glucose. This, in turn, helps you manage your diabetes. Carbohydrates are measured in grams (g) per serving. It is important to know how many carbohydrates (in grams or by serving size) you can have in each meal. This is different for every person. A dietitian can help you make a meal plan and calculate how many carbohydrates you should have at each meal and snack. What foods contain carbohydrates? Carbohydrates are found in the following foods: Grains, such as breads and cereals. Dried beans and soy products. Starchy vegetables, such as potatoes, peas, and corn. Fruit and fruit juices. Milk and yogurt. Sweets and snack foods, such as cake, cookies, candy, chips, and soft drinks. How do I count carbohydrates in foods? There are two ways to count carbohydrates in food. You can read food labels or learn standard serving sizes of foods. You can use either of these methods or a combination of both. Using the Nutrition Facts label The Nutrition Facts list is included on the labels of almost all packaged foods and beverages in the [place] . It includes: The serving size. Information about nutrients in each serving, including the grams of carbohydrate per serving. To use the Nutrition Facts, decide how many servings you will have. Then, multiply the number of servings by the number of carbohydrates per serving. The resulting number is the total grams of carbohydrates that you will be having. Learning the standard serving sizes of foods When you eat carbohydrate foods that are not  packaged or do not include Nutrition Facts on the label, you need to measure the servings in order to count the grams of carbohydrates. Measure the foods that you will eat with a food scale or measuring cup, if needed. Decide how many standard-size servings you will eat. Multiply the number of servings by 15. For foods that contain carbohydrates, one serving equals 15 g of carbohydrates. For example, if you eat 2 cups or 10 oz (300 g) of strawberries, you will have eaten 2 servings and 30 g of carbohydrates (2 servings x 15 g = 30 g). For foods that have more than one food mixed, such as soups and casseroles, you must count the carbohydrates in each food that is included. The following list contains standard serving sizes of common carbohydrate-rich foods. Each of these servings has about 15 g of carbohydrates: 1 slice of bread. 1 six-inch (15 cm) tortilla. ? cup or 2 oz (53 g) cooked rice or pasta.  cup or 3 oz (85 g) cooked or canned, drained and rinsed beans or lentils.  cup or 3 oz (85 g) starchy vegetable, such as peas, corn, or squash.  cup or 4 oz (120 g) hot cereal.  cup or 3 oz (85 g) boiled or mashed potatoes, or  or 3 oz (85 g) of a large baked potato.  cup or 4 fl oz (118 mL) fruit juice. 1 cup or 8 fl oz (237 mL) milk. 1 small or 4 oz (106 g)  apple.  or 2 oz (63 g) of a medium banana. 1 cup or 5 oz (150 g) strawberries. 3 cups or 1 oz (28.3 g) popped popcorn. What is an example of carbohydrate counting? To calculate the grams of carbohydrates in this sample meal, follow the steps shown below. Sample meal 3 oz (85 g) chicken breast. ? cup or 4 oz (106 g) brown rice.  cup or 3 oz (85 g) corn. 1 cup or 8 fl oz (237 mL) milk. 1 cup or 5 oz (150 g) strawberries with sugar-free whipped topping. Carbohydrate calculation Identify the foods that contain carbohydrates: Rice. Corn. Milk. Strawberries. Calculate how many servings you have of each food: 2 servings rice. 1  serving corn. 1 serving milk. 1 serving strawberries. Multiply each number of servings by 15 g: 2 servings rice x 15 g = 30 g. 1 serving corn x 15 g = 15 g. 1 serving milk x 15 g = 15 g. 1 serving strawberries x 15 g = 15 g. Add together all of the amounts to find the total grams of carbohydrates eaten: 30 g + 15 g + 15 g + 15 g = 75 g of carbohydrates total. What are tips for following this plan? Shopping Develop a meal plan and then make a shopping list. Buy fresh and frozen vegetables, fresh and frozen fruit, dairy, eggs, beans, lentils, and whole grains. Look at food labels. Choose foods that have more fiber and less sugar. Avoid processed foods and foods with added sugars. Meal planning Aim to have the same number of grams of carbohydrates at each meal and for each snack time. Plan to have regular, balanced meals and snacks. Where to find more information American Diabetes Association: diabetes.org Centers for Disease Control and Prevention: TonerPromos.no Academy of Nutrition and Dietetics: eatright.org Association of Diabetes Care & Education Specialists: diabeteseducator.org Summary Carbohydrate counting is a method of keeping track of how many carbohydrates you eat. Eating carbohydrates increases the amount of sugar (glucose) in your blood. Counting how many carbohydrates you eat improves how well you manage your blood glucose. This helps you manage your diabetes. A dietitian can help you make a meal plan and calculate how many carbohydrates you should have at each meal and snack. This information is not intended to replace advice given to you by your health care provider. Make sure you discuss any questions you have with your health care provider. Document Revised: 08/07/2019 Document Reviewed: 08/08/2019 Elsevier Patient Education  2024 Elsevier Inc.   Diabetes Mellitus and Nutrition, Adult When you have diabetes, or diabetes mellitus, it is very important to have healthy  eating habits because your blood sugar (glucose) levels are greatly affected by what you eat and drink. Eating healthy foods in the right amounts, at about the same times every day, can help you: Manage your blood glucose. Lower your risk of heart disease. Improve your blood pressure. Reach or maintain a healthy weight. What can affect my meal plan? Every person with diabetes is different, and each person has different needs for a meal plan. Your health care provider may recommend that you work with a dietitian to make a meal plan that is best for you. Your meal plan may vary depending on factors such as: The calories you need. The medicines you take. Your weight. Your blood glucose, blood pressure, and cholesterol levels. Your activity level. Other health conditions you have, such as heart or kidney disease. How do carbohydrates affect me? Carbohydrates, also called carbs, affect  your blood glucose level more than any other type of food. Eating carbs raises the amount of glucose in your blood. It is important to know how many carbs you can safely have in each meal. This is different for every person. Your dietitian can help you calculate how many carbs you should have at each meal and for each snack. How does alcohol affect me? Alcohol can cause a decrease in blood glucose (hypoglycemia), especially if you use insulin or take certain diabetes medicines by mouth. Hypoglycemia can be a life-threatening condition. Symptoms of hypoglycemia, such as sleepiness, dizziness, and confusion, are similar to symptoms of having too much alcohol. Do not drink alcohol if: Your health care provider tells you not to drink. You are pregnant, may be pregnant, or are planning to become pregnant. If you drink alcohol: Limit how much you have to: 0-1 drink a day for women. 0-2 drinks a day for men. Know how much alcohol is in your drink. In the [place] one drink equals one 12 oz bottle of beer (355 mL), one 5 oz  glass of wine (148 mL), or one 1 oz glass of hard liquor (44 mL). Keep yourself hydrated with water, diet soda, or unsweetened iced tea. Keep in mind that regular soda, juice, and other mixers may contain a lot of sugar and must be counted as carbs. What are tips for following this plan?  Reading food labels Start by checking the serving size on the Nutrition Facts label of packaged foods and drinks. The number of calories and the amount of carbs, fats, and other nutrients listed on the label are based on one serving of the item. Many items contain more than one serving per package. Check the total grams (g) of carbs in one serving. Check the number of grams of saturated fats and trans fats in one serving. Choose foods that have a low amount or none of these fats. Check the number of milligrams (mg) of salt (sodium) in one serving. Most people should limit total sodium intake to less than 2,300 mg per day. Always check the nutrition information of foods labeled as "low-fat" or "nonfat." These foods may be higher in added sugar or refined carbs and should be avoided. Talk to your dietitian to identify your daily goals for nutrients listed on the label. Shopping Avoid buying canned, pre-made, or processed foods. These foods tend to be high in fat, sodium, and added sugar. Shop around the outside edge of the grocery store. This is where you will most often find fresh fruits and vegetables, bulk grains, fresh meats, and fresh dairy products. Cooking Use low-heat cooking methods, such as baking, instead of high-heat cooking methods, such as deep frying. Cook using healthy oils, such as olive, canola, or sunflower oil. Avoid cooking with butter, cream, or high-fat meats. Meal planning Eat meals and snacks regularly, preferably at the same times every day. Avoid going long periods of time without eating. Eat foods that are high in fiber, such as fresh fruits, vegetables, beans, and whole grains. Eat  4-6 oz (112-168 g) of lean protein each day, such as lean meat, chicken, fish, eggs, or tofu. One ounce (oz) (28 g) of lean protein is equal to: 1 oz (28 g) of meat, chicken, or fish. 1 egg.  cup (62 g) of tofu. Eat some foods each day that contain healthy fats, such as avocado, nuts, seeds, and fish. What foods should I eat? Fruits Berries. Apples. Oranges. Peaches. Apricots. Plums. Grapes. Mangoes. Papayas.  Pomegranates. Kiwi. Cherries. Vegetables Leafy greens, including lettuce, spinach, kale, chard, collard greens, mustard greens, and cabbage. Beets. Cauliflower. Broccoli. Carrots. Green beans. Tomatoes. Peppers. Onions. Cucumbers. Brussels sprouts. Grains Whole grains, such as whole-wheat or whole-grain bread, crackers, tortillas, cereal, and pasta. Unsweetened oatmeal. Quinoa. Brown or wild rice. Meats and other proteins Seafood. Poultry without skin. Lean cuts of poultry and beef. Tofu. Nuts. Seeds. Dairy Low-fat or fat-free dairy products such as milk, yogurt, and cheese. The items listed above may not be a complete list of foods and beverages you can eat and drink. Contact a dietitian for more information. What foods should I avoid? Fruits Fruits canned with syrup. Vegetables Canned vegetables. Frozen vegetables with butter or cream sauce. Grains Refined white flour and flour products such as bread, pasta, snack foods, and cereals. Avoid all processed foods. Meats and other proteins Fatty cuts of meat. Poultry with skin. Breaded or fried meats. Processed meat. Avoid saturated fats. Dairy Full-fat yogurt, cheese, or milk. Beverages Sweetened drinks, such as soda or iced tea. The items listed above may not be a complete list of foods and beverages you should avoid. Contact a dietitian for more information. Questions to ask a health care provider Do I need to meet with a certified diabetes care and education specialist? Do I need to meet with a dietitian? What number can I  call if I have questions? When are the best times to check my blood glucose? Where to find more information: American Diabetes Association: diabetes.org Academy of Nutrition and Dietetics: eatright.[employer] of Diabetes and Digestive and Kidney Diseases: StageSync.si Association of Diabetes Care & Education Specialists: diabeteseducator.org Summary It is important to have healthy eating habits because your blood sugar (glucose) levels are greatly affected by what you eat and drink. It is important to use alcohol carefully. A healthy meal plan will help you manage your blood glucose and lower your risk of heart disease. Your health care provider may recommend that you work with a dietitian to make a meal plan that is best for you. This information is not intended to replace advice given to you by your health care provider. Make sure you discuss any questions you have with your health care provider. Document Revised: 08/07/2019 Document Reviewed: 08/08/2019 Elsevier Patient Education  2024 [employer].

## 2023-05-25 ENCOUNTER — Ambulatory Visit

## 2023-05-25 ENCOUNTER — Telehealth: Payer: Self-pay | Admitting: Pharmacy Technician

## 2023-05-25 ENCOUNTER — Telehealth: Payer: Self-pay | Admitting: Internal Medicine

## 2023-05-25 ENCOUNTER — Other Ambulatory Visit (HOSPITAL_COMMUNITY): Payer: Self-pay

## 2023-05-25 ENCOUNTER — Other Ambulatory Visit: Payer: Self-pay

## 2023-05-25 DIAGNOSIS — E1165 Type 2 diabetes mellitus with hyperglycemia: Secondary | ICD-10-CM

## 2023-05-25 MED ORDER — FREESTYLE LIBRE 3 READER DEVI: 1.0000 | 0 refills | Status: DC

## 2023-05-25 MED ORDER — FREESTYLE LIBRE 3 SENSOR MISC: 1.0000 | 3 refills | Status: DC

## 2023-05-25 NOTE — Telephone Encounter (Signed)
 Pharmacy Patient Advocate Encounter   Received notification from CoverMyMeds that prior authorization for FREESTYLE LIBRE 3 SENSORS is required/requested.   Insurance verification completed.   The patient is insured through [place] .   Patient must have a diagnosis of insulin dependent diabetes. She currently does not meet the coverage criteria. Please advise.

## 2023-05-25 NOTE — Progress Notes (Signed)
 Patient is in office today for a nurse visit for  Weight check .

## 2023-05-25 NOTE — Telephone Encounter (Signed)
 Re-sent to pharmacy.

## 2023-05-25 NOTE — Telephone Encounter (Signed)
 Patient came into office needs the Continuous Glucose Receiver (FREESTYLE LIBRE 3 READER) DEVI / Continuous Glucose Sensor (FREESTYLE LIBRE 3 SENSOR) MISC [[id number]] approved, per [facility] pharmacy needs a prior authorization.  Pharmacy: [facility]

## 2023-05-26 ENCOUNTER — Telehealth: Payer: Self-pay | Admitting: Internal Medicine

## 2023-05-26 DIAGNOSIS — E1165 Type 2 diabetes mellitus with hyperglycemia: Secondary | ICD-10-CM

## 2023-05-26 NOTE — Telephone Encounter (Signed)
 Copied from CRM [id number]. Topic: Clinical - Medication Refill >> May 26, 2023  2:53 PM Rosaria Common wrote: Medication: Continuous Glucose Receiver (FREESTYLE LIBRE 3 READER) DEVI  Has the patient contacted their pharmacy? Yes (Agent: If no, request that the patient contact the pharmacy for the refill. If patient does not wish to contact the pharmacy document the reason why and proceed with request.) (Agent: If yes, when and what did the pharmacy advise?)  This is the patient's preferred pharmacy:  [facility] [address] [place] - [address] [place] [address] Phone: [phone] Fax: [phone]    Is this the correct pharmacy for this prescription? Yes If no, delete pharmacy and type the correct one.   Has the prescription been filled recently? No  Is the patient out of the medication? Yes  Has the patient been seen for an appointment in the last year OR does the patient have an upcoming appointment? Yes  Can we respond through MyChart? No  Agent: Please be advised that Rx refills may take up to 3 business days. We ask that you follow-up with your pharmacy.

## 2023-05-26 NOTE — Telephone Encounter (Signed)
 [facility] and spoke with staff member, confirmed they received prescriptions for patient. Insurance is requiring prior authorization. Routing to clinic.

## 2023-05-26 NOTE — Telephone Encounter (Signed)
 See previous encounter insurance will not approve due to patient not being insulin dependent

## 2023-05-27 ENCOUNTER — Telehealth: Payer: Self-pay

## 2023-05-27 NOTE — Progress Notes (Signed)
 Care Guide Pharmacy Note  05/27/2023 Name: TAMEICA BARRERAS MRN: [id number] DOB: Jan 13, 1971  Referred By: Tobi Fortes, MD Reason for referral: Complex Care Management (Outreach to schedule with Pharm d )   ADDALYNNE HENK is a 53 y.o. year old female who is a primary care patient of Tobi Fortes, MD.  Kirsten Perches Blahnik was referred to the pharmacist for assistance related to: DMII  Successful contact was made with the patient to discuss pharmacy services including being ready for the pharmacist to call at least 5 minutes before the scheduled appointment time and to have medication bottles and any blood pressure readings ready for review. The patient agreed to meet with the pharmacist via telephone visit on (date/time).05/30/2023  Lenton Rail , RMA     [place]  [facility], [facility] Guide  Direct Dial: [phone]  Website: [email or web address]

## 2023-05-30 ENCOUNTER — Other Ambulatory Visit (HOSPITAL_COMMUNITY): Payer: Self-pay

## 2023-05-30 ENCOUNTER — Other Ambulatory Visit: Payer: Self-pay

## 2023-05-30 NOTE — Progress Notes (Signed)
 05/30/2023  Patient ID: Melinda Giles, female   DOB: [birth year], 53 y.o.   MRN: [id number]      05/30/2023 Name: Melinda Giles MRN: [id number] DOB: 1970-02-19  Chief Complaint  Patient presents with   Medication Management   Diabetes    Melinda Giles is a 53 y.o. year old female who presented for a telephone visit.   They were referred to the pharmacist by their PCP for assistance in managing diabetes.    Subjective:  Care Team: Primary Care Provider: Tobi Fortes, MD ; Next Scheduled Visit:  Future Appointments  Date Time Provider Department Center  05/31/2023  6:30 PM AP-CT 1 AP-CT [facility]  06/07/2023  9:30 AM CWH-FTOBGYN NURSE CWH-FT FTOBGYN  06/07/2023 11:45 AM Remona Carmel, RN CHL-POPH None  06/20/2023  2:00 PM Rolando Cliche, RPH CHL-POPH None  06/23/2023 10:15 AM AP-ACAPA LAB CHCC-APCC None  06/30/2023  9:45 AM Eduardo Grade, MD CHCC-APCC None  07/28/2023 10:40 AM Alison Irvine, FNP RPC-RPC [facility]  05/07/2024  8:40 AM RPC-ANNUAL WELLNESS VISIT RPC-RPC RPC     Medication Access/Adherence  Current Pharmacy:  [facility] [address] [place] - [address] [place] [address] Phone: [phone] Fax: [phone]  [facility] [place], [place] - [address] [place] [address] Phone: [phone] Fax: [phone]   Patient reports affordability concerns with their medications: Yes  Patient reports access/transportation concerns to their pharmacy: No  Patient reports adherence concerns with their medications:  No     Medication Management:  Current adherence strategy: utilizing [facility], feel they might benefit from packing and mail order. Open to considering [place] during today's visit. Patient reports Good adherence to medications  Patient reports the following barriers to adherence:  Recent fill dates:    Objective:  Lab Results  Component Value Date    HGBA1C 6.1 (H) 01/21/2023    Lab Results  Component Value Date   CREATININE 0.72 09/29/2022   BUN 10 09/29/2022   NA 134 (L) 09/29/2022   K 3.6 09/29/2022   CL 102 09/29/2022   CO2 24 09/29/2022    Lab Results  Component Value Date   CHOL 61 (L) 06/01/2022   HDL 36 (L) 06/01/2022   LDLCALC 12 06/01/2022   TRIG 48 06/01/2022   CHOLHDL 1.7 06/01/2022    Medications Reviewed Today     Reviewed by Rolando Cliche, [facility] (Pharmacist) on 05/30/23 at 1445  Med List Status: <None>   Medication Order Taking? Sig Documenting Provider Last Dose Status Informant  atorvastatin  (LIPITOR) 10 MG tablet [id number]  Take 1 tablet (10 mg total) by mouth daily. Tobi Fortes, MD  Active   Blood Glucose Monitoring Suppl DEVI [id number]  1 each by Does not apply route in the morning, at noon, and at bedtime. May substitute to any manufacturer covered by patient's insurance.  Patient not taking: Reported on 05/24/2023   Tobi Fortes, MD  Active   Cholecalciferol (VITAMIN D -3) 25 MCG (1000 UT) CAPS [id number] No Take by mouth.  Patient not taking: Reported on 05/30/2023   [provider] Not Taking Consider Medication Status and Discontinue   Continuous Glucose Receiver (FREESTYLE LIBRE 3 READER) DEVI [id number] No 1 Device by Does not apply route continuous.  Patient not taking: Reported on 05/30/2023   Tobi Fortes, MD Not Taking Active   Continuous Glucose Sensor (FREESTYLE LIBRE 3 SENSOR) MISC  [id number] No 1 Device by Does not apply route every 14 (fourteen) days.  Patient not taking: Reported on 05/30/2023   Tobi Fortes, MD Not Taking Consider Medication Status and Discontinue   losartan  (COZAAR ) 25 MG tablet [id number]  Take 1 tablet (25 mg total) by mouth daily. Tobi Fortes, MD  Active   medroxyPROGESTERone  (DEPO-PROVERA ) 150 MG/ML injection [id number]  Inject 1 mL (150 mg total) into the muscle every 3 (three) months. Wendelyn Halter, MD  Active   metFORMIN  (GLUCOPHAGE ) 500 MG  tablet [id number]  Take 2 tablets (1,000 mg total) by mouth 2 (two) times daily with a meal. Tobi Fortes, MD  Active   metoprolol  succinate (TOPROL -XL) 25 MG 24 hr tablet [id number]  Take 1 tablet (25 mg total) by mouth daily. Tobi Fortes, MD  Active   mupirocin  ointment (BACTROBAN ) 2 % [id number] No Apply 1 Application topically 2 (two) times daily.  Patient not taking: Reported on 05/03/2023   Orval Blanc, DPM Not Taking Consider Medication Status and Discontinue   OZEMPIC , 1 MG/DOSE, 4 MG/3ML SOPN [id number]  INJECT 1 MG SUBCUTANEOUSLY ONCE A WEEK AS DIRECTED Tobi Fortes, MD  Active   pregabalin  (LYRICA ) 150 MG capsule [id number]  Take 1 capsule by mouth twice daily Tobi Fortes, MD  Active               Assessment/Plan:   Medication Management: - Currently strategy insufficient to maintain appropriate adherence to prescribed medication regimen - Suggested use of weekly pill box to organize medications - Discussed collaboration with local pharmacies for adherence packaging. Reviewed local pharmacies with adherence packaging options. Patient elects to reach out to [facility] outpatient pharmacy at their convenience. Number provided to them.   DM - Also reviewed DM management, they are hoping to cut down on metformin  long term. Some dietary areas to be improved - provided suggestions on snacks (eg nuts instead of popcorn), focus on non-starchy vegetables and overall portion control.  - CGM not really feasible given non-insulin dependent DM. Could consider switching plans to [employer], who broadly covers CGM, should there be a need for more intensive BG testing.   Follow Up Plan: requested 2-4wk f/u call. Check on diet, pharmacy.   Rolando Cliche, PharmD, BCGP Clinical Pharmacist  [phone]

## 2023-05-31 ENCOUNTER — Other Ambulatory Visit (HOSPITAL_COMMUNITY): Payer: Self-pay

## 2023-05-31 ENCOUNTER — Other Ambulatory Visit: Payer: Self-pay

## 2023-05-31 ENCOUNTER — Ambulatory Visit (HOSPITAL_COMMUNITY)
Admission: RE | Admit: 2023-05-31 | Discharge: 2023-05-31 | Disposition: A | Discharge status: Home / Self Care | Source: Ambulatory Visit | Attending: Acute Care | Admitting: Acute Care

## 2023-05-31 DIAGNOSIS — J432 Centrilobular emphysema: Secondary | ICD-10-CM | POA: Diagnosis not present

## 2023-05-31 DIAGNOSIS — R911 Solitary pulmonary nodule: Secondary | ICD-10-CM | POA: Diagnosis not present

## 2023-05-31 DIAGNOSIS — I700 Atherosclerosis of aorta: Secondary | ICD-10-CM | POA: Diagnosis not present

## 2023-05-31 MED ORDER — OZEMPIC (0.25 OR 0.5 MG/DOSE) 2 MG/3ML ~~LOC~~ SOPN
0.5000 mg | PEN_INJECTOR | SUBCUTANEOUS | 0 refills | Status: DC
  Filled 2023-05-31: qty 3, 28d supply, fill #0

## 2023-06-05 LAB — LAB REPORT - SCANNED: Microalb Creat Ratio: 30

## 2023-06-06 ENCOUNTER — Other Ambulatory Visit: Payer: Self-pay | Admitting: Internal Medicine

## 2023-06-06 ENCOUNTER — Other Ambulatory Visit (HOSPITAL_COMMUNITY): Payer: Self-pay

## 2023-06-06 DIAGNOSIS — E1169 Type 2 diabetes mellitus with other specified complication: Secondary | ICD-10-CM

## 2023-06-06 DIAGNOSIS — E1165 Type 2 diabetes mellitus with hyperglycemia: Secondary | ICD-10-CM

## 2023-06-06 DIAGNOSIS — I10 Essential (primary) hypertension: Secondary | ICD-10-CM

## 2023-06-06 DIAGNOSIS — E119 Type 2 diabetes mellitus without complications: Secondary | ICD-10-CM

## 2023-06-06 MED ORDER — LOSARTAN POTASSIUM 25 MG PO TABS
25.0000 mg | ORAL_TABLET | Freq: Every day | ORAL | 1 refills | Status: DC
  Filled 2023-06-06: qty 90, 90d supply, fill #0
  Filled 2023-09-13: qty 90, 90d supply, fill #1

## 2023-06-06 MED ORDER — METOPROLOL SUCCINATE ER 25 MG PO TB24
25.0000 mg | ORAL_TABLET | Freq: Every day | ORAL | 3 refills | Status: AC
  Filled 2023-06-06: qty 90, 90d supply, fill #0
  Filled 2023-09-27: qty 90, 90d supply, fill #1
  Filled 2023-12-22: qty 90, 90d supply, fill #2

## 2023-06-06 MED ORDER — PREGABALIN 150 MG PO CAPS
150.0000 mg | ORAL_CAPSULE | Freq: Two times a day (BID) | ORAL | 0 refills | Status: DC
  Filled 2023-06-06: qty 60, 30d supply, fill #0

## 2023-06-06 MED ORDER — ATORVASTATIN CALCIUM 10 MG PO TABS
10.0000 mg | ORAL_TABLET | Freq: Every day | ORAL | 3 refills | Status: AC
  Filled 2023-06-06 – 2023-07-19 (×2): qty 90, 90d supply, fill #0
  Filled 2023-09-27: qty 90, 90d supply, fill #1
  Filled 2023-12-22: qty 90, 90d supply, fill #2

## 2023-06-06 MED ORDER — METFORMIN HCL 500 MG PO TABS
1000.0000 mg | ORAL_TABLET | Freq: Two times a day (BID) | ORAL | 1 refills | Status: DC
  Filled 2023-06-06 – 2023-07-11 (×2): qty 360, 90d supply, fill #0
  Filled 2023-10-10: qty 360, 90d supply, fill #1

## 2023-06-06 NOTE — Telephone Encounter (Signed)
 Copied from CRM [id number]. Topic: Clinical - Medication Refill >> Jun 06, 2023  3:01 PM Talmadge Fail S wrote: Medication: pregabalin  (LYRICA ) 150 MG capsule Requesting a 90 day supply  Has the patient contacted their pharmacy? No (Agent: If no, request that the patient contact the pharmacy for the refill. If patient does not wish to contact the pharmacy document the reason why and proceed with request.) (Agent: If yes, when and what did the pharmacy advise?)  This is the patient's preferred pharmacy:  [facility] [address] [place] [address] Phone: [phone] Fax: [phone]  Is this the correct pharmacy for this prescription? Yes If no, delete pharmacy and type the correct one.   Has the prescription been filled recently? No  Is the patient out of the medication? No  Has the patient been seen for an appointment in the last year OR does the patient have an upcoming appointment? Yes  Can we respond through MyChart? No  Agent: Please be advised that Rx refills may take up to 3 business days. We ask that you follow-up with your pharmacy.

## 2023-06-06 NOTE — Telephone Encounter (Signed)
 Requests 90 day supply

## 2023-06-07 ENCOUNTER — Other Ambulatory Visit: Payer: Self-pay | Admitting: *Deleted

## 2023-06-07 ENCOUNTER — Ambulatory Visit: Payer: 59

## 2023-06-07 ENCOUNTER — Telehealth: Payer: Self-pay

## 2023-06-07 ENCOUNTER — Other Ambulatory Visit (HOSPITAL_COMMUNITY): Payer: Self-pay

## 2023-06-07 ENCOUNTER — Other Ambulatory Visit: Payer: Self-pay

## 2023-06-07 NOTE — Patient Instructions (Signed)
 Visit Information  Thank you for taking time to visit with me today. Please don't hesitate to contact me if I can be of assistance to you before our next scheduled appointment.  Your next care management appointment is by telephone on 07-05-2023 at 11:45 am  Telephone follow-up in 1 month  Please call the care guide team at [phone] if you need to cancel, schedule, or reschedule an appointment.   Please call the Suicide and Crisis Lifeline: 988 call the [place]  National Suicide Prevention Lifeline: 7863793106 or TTY: 651-068-8866 TTY (986)179-5482) to talk to a trained counselor call 1-800-273-TALK (toll free, 24 hour hotline) call the [facility]: (956)015-4649 call 911 if you are experiencing a Mental Health or Behavioral Health Crisis or need someone to talk to.  Grandville Lax, BSN RN [facility]  [facility], [facility] RN Care Manager Direct Dial: [phone]  Fax: [phone]  Diabetes Mellitus and Nutrition, Adult When you have diabetes, or diabetes mellitus, it is very important to have healthy eating habits because your blood sugar (glucose) levels are greatly affected by what you eat and drink. Eating healthy foods in the right amounts, at about the same times every day, can help you: Manage your blood glucose. Lower your risk of heart disease. Improve your blood pressure. Reach or maintain a healthy weight. What can affect my meal plan? Every person with diabetes is different, and each person has different needs for a meal plan. Your health care provider may recommend that you work with a dietitian to make a meal plan that is best for you. Your meal plan may vary depending on factors such as: The calories you need. The medicines you take. Your weight. Your blood glucose, blood pressure, and cholesterol levels. Your activity level. Other health conditions you have, such as heart or kidney disease. How do carbohydrates affect  me? Carbohydrates, also called carbs, affect your blood glucose level more than any other type of food. Eating carbs raises the amount of glucose in your blood. It is important to know how many carbs you can safely have in each meal. This is different for every person. Your dietitian can help you calculate how many carbs you should have at each meal and for each snack. How does alcohol affect me? Alcohol can cause a decrease in blood glucose (hypoglycemia), especially if you use insulin or take certain diabetes medicines by mouth. Hypoglycemia can be a life-threatening condition. Symptoms of hypoglycemia, such as sleepiness, dizziness, and confusion, are similar to symptoms of having too much alcohol. Do not drink alcohol if: Your health care provider tells you not to drink. You are pregnant, may be pregnant, or are planning to become pregnant. If you drink alcohol: Limit how much you have to: 0-1 drink a day for women. 0-2 drinks a day for men. Know how much alcohol is in your drink. In the [place] one drink equals one 12 oz bottle of beer (355 mL), one 5 oz glass of wine (148 mL), or one 1 oz glass of hard liquor (44 mL). Keep yourself hydrated with water, diet soda, or unsweetened iced tea. Keep in mind that regular soda, juice, and other mixers may contain a lot of sugar and must be counted as carbs. What are tips for following this plan?  Reading food labels Start by checking the serving size on the Nutrition Facts label of packaged foods and drinks. The number of calories and the amount of carbs, fats, and other nutrients listed on  the label are based on one serving of the item. Many items contain more than one serving per package. Check the total grams (g) of carbs in one serving. Check the number of grams of saturated fats and trans fats in one serving. Choose foods that have a low amount or none of these fats. Check the number of milligrams (mg) of salt (sodium) in one serving. Most  people should limit total sodium intake to less than 2,300 mg per day. Always check the nutrition information of foods labeled as "low-fat" or "nonfat." These foods may be higher in added sugar or refined carbs and should be avoided. Talk to your dietitian to identify your daily goals for nutrients listed on the label. Shopping Avoid buying canned, pre-made, or processed foods. These foods tend to be high in fat, sodium, and added sugar. Shop around the outside edge of the grocery store. This is where you will most often find fresh fruits and vegetables, bulk grains, fresh meats, and fresh dairy products. Cooking Use low-heat cooking methods, such as baking, instead of high-heat cooking methods, such as deep frying. Cook using healthy oils, such as olive, canola, or sunflower oil. Avoid cooking with butter, cream, or high-fat meats. Meal planning Eat meals and snacks regularly, preferably at the same times every day. Avoid going long periods of time without eating. Eat foods that are high in fiber, such as fresh fruits, vegetables, beans, and whole grains. Eat 4-6 oz (112-168 g) of lean protein each day, such as lean meat, chicken, fish, eggs, or tofu. One ounce (oz) (28 g) of lean protein is equal to: 1 oz (28 g) of meat, chicken, or fish. 1 egg.  cup (62 g) of tofu. Eat some foods each day that contain healthy fats, such as avocado, nuts, seeds, and fish. What foods should I eat? Fruits Berries. Apples. Oranges. Peaches. Apricots. Plums. Grapes. Mangoes. Papayas. Pomegranates. Kiwi. Cherries. Vegetables Leafy greens, including lettuce, spinach, kale, chard, collard greens, mustard greens, and cabbage. Beets. Cauliflower. Broccoli. Carrots. Green beans. Tomatoes. Peppers. Onions. Cucumbers. Brussels sprouts. Grains Whole grains, such as whole-wheat or whole-grain bread, crackers, tortillas, cereal, and pasta. Unsweetened oatmeal. Quinoa. Brown or wild rice. Meats and other  proteins Seafood. Poultry without skin. Lean cuts of poultry and beef. Tofu. Nuts. Seeds. Dairy Low-fat or fat-free dairy products such as milk, yogurt, and cheese. The items listed above may not be a complete list of foods and beverages you can eat and drink. Contact a dietitian for more information. What foods should I avoid? Fruits Fruits canned with syrup. Vegetables Canned vegetables. Frozen vegetables with butter or cream sauce. Grains Refined white flour and flour products such as bread, pasta, snack foods, and cereals. Avoid all processed foods. Meats and other proteins Fatty cuts of meat. Poultry with skin. Breaded or fried meats. Processed meat. Avoid saturated fats. Dairy Full-fat yogurt, cheese, or milk. Beverages Sweetened drinks, such as soda or iced tea. The items listed above may not be a complete list of foods and beverages you should avoid. Contact a dietitian for more information. Questions to ask a health care provider Do I need to meet with a certified diabetes care and education specialist? Do I need to meet with a dietitian? What number can I call if I have questions? When are the best times to check my blood glucose? Where to find more information: American Diabetes Association: diabetes.org Academy of Nutrition and Dietetics: eatright.[employer] of Diabetes and Digestive and Kidney Diseases: StageSync.si Association  of Diabetes Care & Education Specialists: diabeteseducator.org Summary It is important to have healthy eating habits because your blood sugar (glucose) levels are greatly affected by what you eat and drink. It is important to use alcohol carefully. A healthy meal plan will help you manage your blood glucose and lower your risk of heart disease. Your health care provider may recommend that you work with a dietitian to make a meal plan that is best for you. This information is not intended to replace advice given to you by your health  care provider. Make sure you discuss any questions you have with your health care provider. Document Revised: 08/07/2019 Document Reviewed: 08/08/2019 Elsevier Patient Education  2024 [employer].

## 2023-06-07 NOTE — Telephone Encounter (Signed)
 Copied from CRM [id number]. Topic: Clinical - Order For Equipment >> Jun 06, 2023  3:09 PM Georgeann Kindred wrote: Reason for CRM: Patient calling in regards to getting a walker with a seat on it. Ms. Melinda Giles stated that she walker was mentioned in the last appointment and she is wanting an update on the prescription. Please contact [place], [place] to speak to per DPR, at [phone].

## 2023-06-07 NOTE — Telephone Encounter (Signed)
 [place] advised to call patient insurance to see which DME company to send it to since [facility] denied the prescription

## 2023-06-20 ENCOUNTER — Other Ambulatory Visit: Payer: Self-pay

## 2023-06-20 NOTE — Progress Notes (Signed)
 06/20/2023 Name: Melinda Giles MRN: [id number] DOB: 04-18-1970  Chief Complaint  Patient presents with   Medication Management    Melinda Giles is a 53 y.o. year old female who presented for a telephone visit.   They were referred to the pharmacist by their PCP for assistance in managing diabetes.    Subjective:  Care Team: Primary Care Provider: Tobi Fortes, MD ; Next Scheduled Visit:  Future Appointments  Date Time Provider Department Center  06/23/2023 10:15 AM AP-ACAPA LAB CHCC-APCC None  06/30/2023  9:45 AM Eduardo Grade, MD CHCC-APCC None  07/05/2023 11:45 AM Remona Carmel, RN CHL-POPH None  07/28/2023 10:40 AM Alison Irvine, FNP RPC-RPC [facility]  05/07/2024  8:40 AM RPC-ANNUAL WELLNESS VISIT RPC-RPC RPC     Medication Access/Adherence  Current Pharmacy:  [facility] [address] [place] - [address] [place] [address] Phone: [phone] Fax: [phone]  [facility] [place], [place] - [address] [place] [address] Phone: [phone] Fax: [phone]  [place] [facility] [address] [place] [address] Phone: [phone] Fax: [phone]   Patient reports affordability concerns with their medications: Yes  Patient reports access/transportation concerns to their pharmacy: No  Patient reports adherence concerns with their medications:  No     Medication Management:  Current adherence strategy: [facility], feel they might benefit from packing and mail order. Open to considering [place] during today's visit. Patient reports Good adherence to medications  Patient reports the following barriers to adherence:  Recent fill dates:    Objective:  Lab Results  Component Value Date   HGBA1C 6.1 (H) 01/21/2023    Lab Results  Component Value Date   CREATININE 0.72 09/29/2022   BUN 10 09/29/2022   NA 134 (L) 09/29/2022    K 3.6 09/29/2022   CL 102 09/29/2022   CO2 24 09/29/2022    Lab Results  Component Value Date   CHOL 61 (L) 06/01/2022   HDL 36 (L) 06/01/2022   LDLCALC 12 06/01/2022   TRIG 48 06/01/2022   CHOLHDL 1.7 06/01/2022    Medications Reviewed Today   Medications were not reviewed in this encounter      Assessment/Plan:   Medication Management: - Currently strategy insufficient to maintain appropriate adherence to prescribed medication regimen - Suggested use of weekly pill box to organize medications - Discussed collaboration with local pharmacies for adherence packaging. Reviewed local pharmacies with adherence packaging options. Patient elects to reach out to [facility] outpatient pharmacy at their convenience. Number provided to them.   DM - Also reviewed DM management, they are hoping to cut down on metformin  long term. Some dietary areas to be improved - provided suggestions on snacks (eg nuts instead of popcorn), focus on non-starchy vegetables and overall portion control.  - CGM not really feasible given non-insulin dependent DM. Could consider switching plans to [employer], who broadly covers CGM, should there be a need for more intensive BG testing.   Follow Up Plan: requested 2-4wk f/u call. Check on diet, pharmacy.   06/20/2023 Update - Reviewed current prescription related needs. Now on ozempic  1mg , continues metformin  1000mg  bid. No updates on BG at home, plans to test on occasion moving forward given well controlled per A1c. Has June f/u with onc and July with PCP. Is happy with transition to [facility] pharmacy - no questions or concerns at this time.  Rolando Cliche, PharmD, BCGP Clinical Pharmacist  [phone]

## 2023-06-23 ENCOUNTER — Inpatient Hospital Stay: Attending: Oncology

## 2023-06-23 DIAGNOSIS — Z7984 Long term (current) use of oral hypoglycemic drugs: Secondary | ICD-10-CM | POA: Insufficient documentation

## 2023-06-23 DIAGNOSIS — R7982 Elevated C-reactive protein (CRP): Secondary | ICD-10-CM | POA: Diagnosis not present

## 2023-06-23 DIAGNOSIS — R7402 Elevation of levels of lactic acid dehydrogenase (LDH): Secondary | ICD-10-CM | POA: Insufficient documentation

## 2023-06-23 DIAGNOSIS — R7989 Other specified abnormal findings of blood chemistry: Secondary | ICD-10-CM | POA: Diagnosis not present

## 2023-06-23 DIAGNOSIS — M25559 Pain in unspecified hip: Secondary | ICD-10-CM | POA: Diagnosis not present

## 2023-06-23 DIAGNOSIS — Z79899 Other long term (current) drug therapy: Secondary | ICD-10-CM | POA: Insufficient documentation

## 2023-06-23 DIAGNOSIS — D72829 Elevated white blood cell count, unspecified: Secondary | ICD-10-CM | POA: Insufficient documentation

## 2023-06-23 DIAGNOSIS — Z793 Long term (current) use of hormonal contraceptives: Secondary | ICD-10-CM | POA: Insufficient documentation

## 2023-06-23 DIAGNOSIS — D72825 Bandemia: Secondary | ICD-10-CM

## 2023-06-23 LAB — COMPREHENSIVE METABOLIC PANEL WITH GFR
ALT: 15 U/L (ref 0–44)
AST: 17 U/L (ref 15–41)
Albumin: 3.5 g/dL (ref 3.5–5.0)
Alkaline Phosphatase: 46 U/L (ref 38–126)
Anion gap: 7 (ref 5–15)
BUN: 11 mg/dL (ref 6–20)
CO2: 22 mmol/L (ref 22–32)
Calcium: 9 mg/dL (ref 8.9–10.3)
Chloride: 106 mmol/L (ref 98–111)
Creatinine, Ser: 0.73 mg/dL (ref 0.44–1.00)
GFR, Estimated: >60 mL/min (ref >60)
Glucose, Bld: 114 mg/dL — ABNORMAL HIGH (ref 70–99)
Potassium: 3.8 mmol/L (ref 3.5–5.1)
Sodium: 135 mmol/L (ref 135–145)
Total Bilirubin: 0.9 mg/dL (ref 0.0–1.2)
Total Protein: 7.8 g/dL (ref 6.5–8.1)

## 2023-06-23 LAB — CBC WITH DIFFERENTIAL/PLATELET
Abs Immature Granulocytes: 0.09 10*3/uL — ABNORMAL HIGH (ref 0.00–0.07)
Basophils Absolute: 0.1 10*3/uL (ref 0.0–0.1)
Basophils Relative: 1 %
Eosinophils Absolute: 0.2 10*3/uL (ref 0.0–0.5)
Eosinophils Relative: 1 %
HCT: 38 % (ref 36.0–46.0)
Hemoglobin: 12.5 g/dL (ref 12.0–15.0)
Immature Granulocytes: 1 %
Lymphocytes Relative: 23 %
Lymphs Abs: 3.1 10*3/uL (ref 0.7–4.0)
MCH: 29.9 pg (ref 26.0–34.0)
MCHC: 32.9 g/dL (ref 30.0–36.0)
MCV: 90.9 fL (ref 80.0–100.0)
Monocytes Absolute: 0.9 10*3/uL (ref 0.1–1.0)
Monocytes Relative: 7 %
Neutro Abs: 9.1 10*3/uL — ABNORMAL HIGH (ref 1.7–7.7)
Neutrophils Relative %: 67 %
Platelets: 349 10*3/uL (ref 150–400)
RBC: 4.18 MIL/uL (ref 3.87–5.11)
RDW: 13.3 % (ref 11.5–15.5)
WBC: 13.3 10*3/uL — ABNORMAL HIGH (ref 4.0–10.5)
nRBC: 0 % (ref 0.0–0.2)

## 2023-06-23 LAB — FERRITIN: Ferritin: 163 ng/mL (ref 11–307)

## 2023-06-23 LAB — C-REACTIVE PROTEIN: CRP: 1 mg/dL — ABNORMAL HIGH (ref <1.0)

## 2023-06-23 LAB — SEDIMENTATION RATE: Sed Rate: 33 mm/h — ABNORMAL HIGH (ref 0–22)

## 2023-06-23 LAB — LACTATE DEHYDROGENASE: LDH: 121 U/L (ref 98–192)

## 2023-06-23 LAB — URIC ACID: Uric Acid, Serum: 7.5 mg/dL — ABNORMAL HIGH (ref 2.5–7.1)

## 2023-06-24 ENCOUNTER — Other Ambulatory Visit: Payer: Self-pay

## 2023-06-24 LAB — SURGICAL PATHOLOGY

## 2023-06-27 LAB — FLOW CYTOMETRY

## 2023-06-30 ENCOUNTER — Inpatient Hospital Stay: Admitting: Oncology

## 2023-06-30 VITALS — BP 125/98 | HR 104 | Temp 98.7°F | Resp 20 | Wt 269.0 lb

## 2023-06-30 DIAGNOSIS — R7989 Other specified abnormal findings of blood chemistry: Secondary | ICD-10-CM | POA: Diagnosis not present

## 2023-06-30 DIAGNOSIS — Z7984 Long term (current) use of oral hypoglycemic drugs: Secondary | ICD-10-CM | POA: Diagnosis not present

## 2023-06-30 DIAGNOSIS — Z79899 Other long term (current) drug therapy: Secondary | ICD-10-CM | POA: Diagnosis not present

## 2023-06-30 DIAGNOSIS — D72829 Elevated white blood cell count, unspecified: Secondary | ICD-10-CM | POA: Diagnosis not present

## 2023-06-30 DIAGNOSIS — M25559 Pain in unspecified hip: Secondary | ICD-10-CM | POA: Diagnosis not present

## 2023-06-30 DIAGNOSIS — R7982 Elevated C-reactive protein (CRP): Secondary | ICD-10-CM | POA: Diagnosis not present

## 2023-06-30 DIAGNOSIS — R7402 Elevation of levels of lactic acid dehydrogenase (LDH): Secondary | ICD-10-CM | POA: Diagnosis not present

## 2023-06-30 NOTE — Patient Instructions (Signed)
 VISIT SUMMARY:  You came in today for a follow-up on your elevated white blood cell count. You are currently not experiencing any symptoms such as fever, chills, night sweats, joint pains, or rash. Previous tests for leukemia were negative, and your ferritin levels are normal, although some inflammatory markers are elevated. We also discussed your history of hip replacement surgery and the use of a stick for your hips.  YOUR PLAN:   -ELEVATED WHITE BLOOD CELL COUNT: An elevated white blood cell count can be a sign of inflammation or infection, but in your case, it is likely due to obesity. Since you are not experiencing any symptoms and your ferritin levels are normal, we will monitor your condition. You are encouraged to lose weight to help reduce the white blood cell count. We will reassess in six months and discharge you to primary care if you remain asymptomatic.  INSTRUCTIONS:  Please schedule a follow-up appointment in six months to reassess your condition. If you remain without symptoms, you will be discharged to primary care at that time.

## 2023-06-30 NOTE — Assessment & Plan Note (Signed)
 Patient has persistent mild WBC elevation with neutrophil predominance.  Discussed with patient that obesity can cause slight leukocytosis.  Hip pain likely contributing as well. No B symptoms at this time.  Denies any signs of infection including fevers and chills.  Flow cytometry: Negative ESR, CRP: Elevated Slight elevation of uric acid but normal LDH  - Continue to monitor WBC count. - If stable, no B symptoms can be discharged to primary care for close monitoring - No further workup needed at this time  Return to clinic in 6 months with labs

## 2023-06-30 NOTE — Assessment & Plan Note (Signed)
 Resolved at this time.

## 2023-06-30 NOTE — Progress Notes (Signed)
 [facility] at [facility] HEMATOLOGY FOLLOW-UP VISIT  Tobi Fortes, MD  REASON FOR FOLLOW-UP: Elevated ferritin and leukocytosis  ASSESSMENT & PLAN:  Patient is a 53 year old female with elevated ferritin levels and leukocytosis.    Leukocytosis Patient has persistent mild WBC elevation with neutrophil predominance.  Discussed with patient that obesity can cause slight leukocytosis.  Hip pain likely contributing as well. No B symptoms at this time.  Denies any signs of infection including fevers and chills.  Flow cytometry: Negative ESR, CRP: Elevated Slight elevation of uric acid but normal LDH  - Continue to monitor WBC count. - If stable, no B symptoms can be discharged to primary care for close monitoring - No further workup needed at this time  Return to clinic in 6 months with labs  Elevated ferritin Resolved at this time    Orders Placed This Encounter  Procedures   CBC with Differential/Platelet    Standing Status:   Future    Expected Date:   12/26/2023    Expiration Date:   03/25/2024   Comprehensive metabolic panel with GFR    Standing Status:   Future    Expected Date:   12/26/2023    Expiration Date:   03/25/2024   Ferritin    Standing Status:   Future    Expected Date:   12/26/2023    Expiration Date:   03/25/2024   Uric acid    Standing Status:   Future    Expected Date:   12/26/2023    Expiration Date:   03/25/2024   Sedimentation rate    Standing Status:   Future    Expected Date:   12/26/2023    Expiration Date:   03/25/2024   C-reactive protein    Standing Status:   Future    Expected Date:   12/26/2023    Expiration Date:   03/25/2024    The total time spent in the appointment was 20 minutes encounter with patients including review of chart and various tests results, discussions about plan of care and coordination of care plan   All questions were answered. The patient knows to call the clinic with any problems, questions or  concerns. No barriers to learning was detected.  Eduardo Grade, MD 6/12/202510:10 AM   INTERVAL HISTORY: Ala Kratz Jim 53 y.o. female following for elevated ferritin and leukocytosis.  She is accompanied by her aunt today.  She has no complaints today. She denies fever, chills, night sweats, unintentional weight loss, and fatigue.  Overall is doing well.  I have reviewed the past medical history, past surgical history, social history and family history with the patient   ALLERGIES:  is allergic to lisinopril and other.  MEDICATIONS:  Current Outpatient Medications  Medication Sig Dispense Refill   atorvastatin  (LIPITOR) 10 MG tablet Take 1 tablet (10 mg total) by mouth daily. 90 tablet 3   Blood Glucose Monitoring Suppl DEVI 1 each by Does not apply route in the morning, at noon, and at bedtime. May substitute to any manufacturer covered by patient's insurance. (Patient not taking: Reported on 05/24/2023) 1 each 0   Cholecalciferol (VITAMIN D -3) 25 MCG (1000 UT) CAPS Take by mouth.     Continuous Glucose Receiver (FREESTYLE LIBRE 3 READER) DEVI 1 Device by Does not apply route continuous. (Patient not taking: Reported on 05/30/2023) 1 each 0   Continuous Glucose Sensor (FREESTYLE LIBRE 3 SENSOR) MISC Apply every 14 (fourteen) days. 1 each 3  losartan  (COZAAR ) 25 MG tablet Take 1 tablet (25 mg total) by mouth daily. 90 tablet 1   medroxyPROGESTERone  (DEPO-PROVERA ) 150 MG/ML injection Inject 1 mL (150 mg total) into the muscle every 3 (three) months. (Patient not taking: Reported on 06/30/2023) 1 mL 3   metFORMIN  (GLUCOPHAGE ) 500 MG tablet Take 2 tablets (1,000 mg total) by mouth 2 (two) times daily with a meal. 360 tablet 1   metoprolol  succinate (TOPROL -XL) 25 MG 24 hr tablet Take 1 tablet (25 mg total) by mouth daily. 90 tablet 3   mupirocin  ointment (BACTROBAN ) 2 % Apply 1 Application topically 2 (two) times daily. (Patient not taking: Reported on 05/03/2023) 30 g 1   OZEMPIC , 1 MG/DOSE, 4  MG/3ML SOPN INJECT 1 MG SUBCUTANEOUSLY ONCE A WEEK AS DIRECTED 3 mL 0   pregabalin  (LYRICA ) 150 MG capsule Take 1 capsule (150 mg total) by mouth 2 (two) times daily. 60 capsule 0   Semaglutide ,0.25 or 0.5MG /DOS, (OZEMPIC , 0.25 OR 0.5 MG/DOSE,) 2 MG/3ML SOPN Inject 0.5 mg into the skin every 7 (seven) days. 3 mL 0   No current facility-administered medications for this visit.     REVIEW OF SYSTEMS:   Constitutional: Denies fevers, chills or night sweats Eyes: Denies blurriness of vision Ears, nose, mouth, throat, and face: Denies mucositis or sore throat Respiratory: Denies cough, dyspnea or wheezes Cardiovascular: Denies palpitation, chest discomfort or lower extremity swelling Gastrointestinal:  Denies nausea, heartburn or change in bowel habits Skin: Denies abnormal skin rashes Lymphatics: Denies new lymphadenopathy or easy bruising Neurological:Denies numbness, tingling or new weaknesses Behavioral/Psych: Mood is stable, no new changes  All other systems were reviewed with the patient and are negative.  PHYSICAL EXAMINATION:   Vitals:   06/30/23 0943 06/30/23 0944  BP: (!) 123/94 (!) 125/98  Pulse: (!) 104   Resp: 20   Temp: 98.7 F (37.1 C)   SpO2: 100%      GENERAL:alert, no distress and comfortable LYMPH:  no palpable lymphadenopathy in the cervical, axillary or inguinal LUNGS: clear to auscultation and percussion with normal breathing effort HEART: regular rate & rhythm and no murmurs and no lower extremity edema ABDOMEN:abdomen soft, non-tender and normal bowel sounds Musculoskeletal:no cyanosis of digits and no clubbing  NEURO: alert & oriented x 3 with fluent speech  LABORATORY DATA:  I have reviewed the data as listed  CBC    Component Value Date/Time   WBC 13.3 (H) 06/23/2023 0957   RBC 4.18 06/23/2023 0957   HGB 12.5 06/23/2023 0957   HGB 13.9 06/01/2022 1102   HCT 38.0 06/23/2023 0957   HCT 40.7 06/01/2022 1102   PLT 349 06/23/2023 0957   PLT 313  06/01/2022 1102   MCV 90.9 06/23/2023 0957   MCV 89 06/01/2022 1102   MCH 29.9 06/23/2023 0957   MCHC 32.9 06/23/2023 0957   RDW 13.3 06/23/2023 0957   RDW 11.9 06/01/2022 1102   LYMPHSABS 3.1 06/23/2023 0957   LYMPHSABS 2.8 06/01/2022 1102   MONOABS 0.9 06/23/2023 0957   EOSABS 0.2 06/23/2023 0957   EOSABS 0.2 06/01/2022 1102   BASOSABS 0.1 06/23/2023 0957   BASOSABS 0.1 06/01/2022 1102        Chemistry      Component Value Date/Time   NA 135 06/23/2023 0957   NA 141 06/01/2022 1102   K 3.8 06/23/2023 0957   CL 106 06/23/2023 0957   CO2 22 06/23/2023 0957   BUN 11 06/23/2023 0957   BUN 9 06/01/2022 1102  CREATININE 0.73 06/23/2023 0957      Component Value Date/Time   CALCIUM  9.0 06/23/2023 0957   ALKPHOS 46 06/23/2023 0957   AST 17 06/23/2023 0957   ALT 15 06/23/2023 0957   BILITOT 0.9 06/23/2023 0957   BILITOT 0.9 06/01/2022 1102      Latest Reference Range & Units 06/23/23 09:57  LDH 98 - 192 U/L 121  Ferritin 11 - 307 ng/mL 163  CRP <1.0 mg/dL 1.0 (H)  (H): Data is abnormally high  Latest Reference Range & Units 06/23/23 09:57  Sed Rate 0 - 22 mm/hr 33 (H)  (H): Data is abnormally high  Flow cytometry: 06/23/2023  DIAGNOSIS:   - No abnormal B or T-cell population identified   GATING AND PHENOTYPIC ANALYSIS:   Gated population: Flow cytometric immunophenotyping is performed using antibodies to the antigens listed in the table below. Electronic gates are placed around a cell cluster displaying light scatter properties corresponding to: lymphocytes   Abnormal Cells in gated population: N/A   Phenotype of Abnormal Cells: N/A

## 2023-07-05 ENCOUNTER — Telehealth: Payer: Self-pay | Admitting: *Deleted

## 2023-07-05 ENCOUNTER — Encounter: Payer: Self-pay | Admitting: *Deleted

## 2023-07-11 ENCOUNTER — Other Ambulatory Visit: Payer: Self-pay | Admitting: Internal Medicine

## 2023-07-11 ENCOUNTER — Other Ambulatory Visit (HOSPITAL_COMMUNITY): Payer: Self-pay

## 2023-07-11 MED ORDER — PREGABALIN 150 MG PO CAPS
150.0000 mg | ORAL_CAPSULE | Freq: Two times a day (BID) | ORAL | 0 refills | Status: DC
  Filled 2023-07-11: qty 60, 30d supply, fill #0

## 2023-07-12 ENCOUNTER — Other Ambulatory Visit (HOSPITAL_COMMUNITY): Payer: Self-pay

## 2023-07-12 ENCOUNTER — Other Ambulatory Visit: Payer: Self-pay | Admitting: Acute Care

## 2023-07-12 ENCOUNTER — Other Ambulatory Visit: Payer: Self-pay

## 2023-07-12 DIAGNOSIS — Z87891 Personal history of nicotine dependence: Secondary | ICD-10-CM

## 2023-07-12 DIAGNOSIS — Z122 Encounter for screening for malignant neoplasm of respiratory organs: Secondary | ICD-10-CM

## 2023-07-19 ENCOUNTER — Other Ambulatory Visit (HOSPITAL_COMMUNITY): Payer: Self-pay

## 2023-07-19 ENCOUNTER — Other Ambulatory Visit: Payer: Self-pay

## 2023-07-26 ENCOUNTER — Telehealth: Payer: Self-pay

## 2023-07-26 ENCOUNTER — Other Ambulatory Visit (HOSPITAL_COMMUNITY): Payer: Self-pay

## 2023-07-26 ENCOUNTER — Other Ambulatory Visit: Payer: Self-pay | Admitting: Internal Medicine

## 2023-07-26 ENCOUNTER — Other Ambulatory Visit: Payer: Self-pay

## 2023-07-26 MED ORDER — OZEMPIC (0.25 OR 0.5 MG/DOSE) 2 MG/3ML ~~LOC~~ SOPN
0.5000 mg | PEN_INJECTOR | SUBCUTANEOUS | 0 refills | Status: DC
  Filled 2023-07-26: qty 3, 28d supply, fill #0

## 2023-07-26 NOTE — Telephone Encounter (Signed)
 Who is your primary care physician: Dr.Phillip Dixon  Reasons for the colonoscopy: screening  Have you had a colonoscopy before?  Yes 10-04-22 Dr.Carver   Do you have family history of colon cancer? no  Previous colonoscopy with polyps removed? no  Do you have a history colorectal cancer?   no  Are you diabetic? If yes, Type 1 or Type 2?    Yes Type 2  Do you have a prosthetic or mechanical heart valve? no  Do you have a pacemaker/defibrillator?   no  Have you had endocarditis/atrial fibrillation? no  Have you had joint replacement within the last 12 months?  no  Do you tend to be constipated or have to use laxatives? no  Do you have any history of drugs or alchohol?  Yes 3-4 drinks daily  Do you use supplemental oxygen?  no  Have you had a stroke or heart attack within the last 6 months? no  Do you take weight loss medication?  yes  For female patients: have you had a hysterectomy?  no                                     are you post menopausal?       yes                                            do you still have your menstrual cycle? no      Do you take any blood-thinning medications such as: (aspirin, warfarin, Plavix, Aggrenox)  no  If yes we need the name, milligram, dosage and who is prescribing doctor  Current Outpatient Medications on File Prior to Visit  Medication Sig Dispense Refill   atorvastatin  (LIPITOR) 10 MG tablet Take 1 tablet (10 mg total) by mouth daily. 90 tablet 3   Blood Glucose Monitoring Suppl DEVI 1 each by Does not apply route in the morning, at noon, and at bedtime. May substitute to any manufacturer covered by patient's insurance. (Patient not taking: Reported on 05/24/2023) 1 each 0   Cholecalciferol (VITAMIN D -3) 25 MCG (1000 UT) CAPS Take by mouth.     Continuous Glucose Receiver (FREESTYLE LIBRE 3 READER) DEVI 1 Device by Does not apply route continuous. (Patient not taking: Reported on 05/30/2023) 1 each 0   Continuous Glucose Sensor  (FREESTYLE LIBRE 3 SENSOR) MISC Apply every 14 (fourteen) days. 1 each 3   losartan  (COZAAR ) 25 MG tablet Take 1 tablet (25 mg total) by mouth daily. 90 tablet 1   medroxyPROGESTERone  (DEPO-PROVERA ) 150 MG/ML injection Inject 1 mL (150 mg total) into the muscle every 3 (three) months. (Patient not taking: Reported on 06/30/2023) 1 mL 3   metFORMIN  (GLUCOPHAGE ) 500 MG tablet Take 2 tablets (1,000 mg total) by mouth 2 (two) times daily with a meal. 360 tablet 1   metoprolol  succinate (TOPROL -XL) 25 MG 24 hr tablet Take 1 tablet (25 mg total) by mouth daily. 90 tablet 3   mupirocin  ointment (BACTROBAN ) 2 % Apply 1 Application topically 2 (two) times daily. (Patient not taking: Reported on 05/03/2023) 30 g 1   OZEMPIC , 1 MG/DOSE, 4 MG/3ML SOPN INJECT 1 MG SUBCUTANEOUSLY ONCE A WEEK AS DIRECTED 3 mL 0   pregabalin  (LYRICA ) 150 MG capsule Take 1 capsule (150 mg total) by  mouth 2 (two) times daily. 60 capsule 0   Semaglutide ,0.25 or 0.5MG /DOS, (OZEMPIC , 0.25 OR 0.5 MG/DOSE,) 2 MG/3ML SOPN Inject 0.5 mg into the skin every 7 (seven) days. 3 mL 0   No current facility-administered medications on file prior to visit.    Allergies  Allergen Reactions   Lisinopril Anaphylaxis   Other Anaphylaxis and Swelling    pril  family of bp meds     Ph[facility]acy: [facility]  Primary Insurance Name: [facility]  Best number where you can be reached: [phone]

## 2023-07-27 ENCOUNTER — Other Ambulatory Visit: Payer: Self-pay

## 2023-07-28 ENCOUNTER — Other Ambulatory Visit (HOSPITAL_COMMUNITY): Payer: Self-pay

## 2023-07-28 ENCOUNTER — Other Ambulatory Visit: Payer: Self-pay

## 2023-07-28 ENCOUNTER — Ambulatory Visit (INDEPENDENT_AMBULATORY_CARE_PROVIDER_SITE_OTHER)

## 2023-07-28 VITALS — BP 113/82 | HR 107 | Ht 65.0 in | Wt 265.0 lb

## 2023-07-28 DIAGNOSIS — G621 Alcoholic polyneuropathy: Secondary | ICD-10-CM | POA: Diagnosis not present

## 2023-07-28 DIAGNOSIS — E1165 Type 2 diabetes mellitus with hyperglycemia: Secondary | ICD-10-CM | POA: Diagnosis not present

## 2023-07-28 DIAGNOSIS — Z7985 Long-term (current) use of injectable non-insulin antidiabetic drugs: Secondary | ICD-10-CM

## 2023-07-28 MED ORDER — SEMAGLUTIDE (1 MG/DOSE) 4 MG/3ML ~~LOC~~ SOPN
1.0000 mg | PEN_INJECTOR | SUBCUTANEOUS | 1 refills | Status: DC
  Filled 2023-07-28: qty 3, 28d supply, fill #0
  Filled 2023-09-27: qty 3, 28d supply, fill #1

## 2023-07-28 MED ORDER — PREGABALIN 150 MG PO CAPS
150.0000 mg | ORAL_CAPSULE | Freq: Two times a day (BID) | ORAL | 0 refills | Status: DC
  Filled 2023-07-28 – 2023-08-12 (×2): qty 60, 30d supply, fill #0

## 2023-07-28 NOTE — Progress Notes (Signed)
 Established Patient Office Visit  Subjective   Patient ID: Melinda Giles, female    DOB: 1970-07-31  Age: 53 y.o. MRN: [id number]  Chief Complaint  Patient presents with   Medical Management of Chronic Issues    Follow up   HPI  Patient Active Problem List   Diagnosis Date Noted   Leukocytosis 03/28/2023   Encounter for screening fecal occult blood testing 01/31/2023   Encounter for well woman exam with routine gynecological exam 01/31/2023   Depo-Provera  contraceptive status 01/31/2023   Morbid obesity (HCC) 01/21/2023   Need for influenza vaccination 10/19/2022   Elevated ferritin 09/22/2022   Essential hypertension 06/01/2022   Bilateral hip joint arthritis 06/01/2022   Type 2 diabetes mellitus (HCC) 06/01/2022   Hyperlipidemia associated with type 2 diabetes mellitus (HCC) 06/01/2022   Cognitive impairment 06/01/2022   Former tobacco use 06/01/2022   Colon cancer screening 06/01/2022   Unilateral primary osteoarthritis, left hip 03/16/2021   Unilateral primary osteoarthritis, right hip 03/16/2021   Retained orthopedic hardware 03/16/2021   Schizophrenia (HCC)    Cirrhosis of liver (HCC) 12/24/2020   Altered mental status 11/09/2020   Seizure (HCC) 11/09/2020   Vitamin D  deficiency 08/29/2019   Anemia 07/18/2019   Abnormal gait 05/30/2019   Alcoholic polyneuropathy (HCC) 94/87/7978   Hand pain 04/19/2019   Neuropathy 04/19/2019   Foot pain 04/19/2019      ROS    Objective:     BP 113/82   Pulse (!) 107   Ht 5' 5 (1.651 m)   Wt 265 lb (120.2 kg)   SpO2 95%   BMI 44.10 kg/m  BP Readings from Last 3 Encounters:  07/28/23 113/82  06/30/23 (!) 125/98  04/21/23 117/79   Wt Readings from Last 3 Encounters:  07/28/23 265 lb (120.2 kg)  06/30/23 269 lb (122 kg)  05/25/23 266 lb 1.9 oz (120.7 kg)      Physical Exam Vitals and nursing note reviewed. Exam conducted with a chaperone present (Patient's aunt is with her, she is her caregiver.).   Constitutional:      Appearance: She is obese.  HENT:     Head: Normocephalic.  Eyes:     Extraocular Movements: Extraocular movements intact.     Pupils: Pupils are equal, round, and reactive to light.  Cardiovascular:     Rate and Rhythm: Normal rate and regular rhythm.  Pulmonary:     Effort: Pulmonary effort is normal.     Breath sounds: Normal breath sounds.  Musculoskeletal:     Cervical back: Normal range of motion and neck supple.  Neurological:     Mental Status: She is alert and oriented to person, place, and time.  Psychiatric:        Mood and Affect: Mood normal.        Thought Content: Thought content normal.      No results found for any visits on 07/28/23.  Last CBC Lab Results  Component Value Date   WBC 13.3 (H) 06/23/2023   HGB 12.5 06/23/2023   HCT 38.0 06/23/2023   MCV 90.9 06/23/2023   MCH 29.9 06/23/2023   RDW 13.3 06/23/2023   PLT 349 06/23/2023   Last metabolic panel Lab Results  Component Value Date   GLUCOSE 114 (H) 06/23/2023   NA 135 06/23/2023   K 3.8 06/23/2023   CL 106 06/23/2023   CO2 22 06/23/2023   BUN 11 06/23/2023   CREATININE 0.73 06/23/2023   GFRNONAA >60 06/23/2023  CALCIUM  9.0 06/23/2023   PROT 7.8 06/23/2023   ALBUMIN 3.5 06/23/2023   LABGLOB 3.5 06/01/2022   AGRATIO 1.1 (L) 06/01/2022   BILITOT 0.9 06/23/2023   ALKPHOS 46 06/23/2023   AST 17 06/23/2023   ALT 15 06/23/2023   ANIONGAP 7 06/23/2023   Last lipids Lab Results  Component Value Date   CHOL 61 (L) 06/01/2022   HDL 36 (L) 06/01/2022   LDLCALC 12 06/01/2022   TRIG 48 06/01/2022   CHOLHDL 1.7 06/01/2022   Last hemoglobin A1c Lab Results  Component Value Date   HGBA1C 6.1 (H) 01/21/2023      The ASCVD Risk score (Arnett DK, et al., 2019) failed to calculate for the following reasons:   The valid total cholesterol range is 130 to 320 mg/dL    Assessment & Plan:   Problem List Items Addressed This Visit       Endocrine   Type 2 diabetes  mellitus (HCC) - Primary   A1c 6.1 on labs from January.  Ozempic  was started at that time.  She has not experienced any adverse side effects.  She is additionally prescribed metformin  1000 mg twice daily.  Her current weight is 265 pounds, which is the same as her weight at her appointment in January.  Of note she recently stopped Depo-Provera  shots in hopes that this will help with weight loss. - Continue with Ozempic  to 1 mg weekly -Repeat A1c today      Relevant Medications   Semaglutide , 1 MG/DOSE, 4 MG/3ML SOPN   Other Relevant Orders   HgB A1c     Nervous and Auditory   Alcoholic polyneuropathy (HCC)   Recently evaluated by neurology.  She is currently prescribed Lyrica  150 mg twice daily.      Relevant Medications   pregabalin  (LYRICA ) 150 MG capsule    Return in about 3 months (around 10/28/2023) for chronic follow-up with PCP.    Leita Longs, FNP

## 2023-07-28 NOTE — Assessment & Plan Note (Addendum)
 A1c 6.1 on labs from January.  Ozempic  was started at that time.  She has not experienced any adverse side effects.  She is additionally prescribed metformin  1000 mg twice daily.  Her current weight is 265 pounds, which is the same as her weight at her appointment in January.  Of note she recently stopped Depo-Provera  shots in hopes that this will help with weight loss. - Continue with Ozempic  to 1 mg weekly -Repeat A1c today

## 2023-07-28 NOTE — Assessment & Plan Note (Signed)
Recently evaluated by neurology.  She is currently prescribed Lyrica 150 mg twice daily.

## 2023-07-29 LAB — HEMOGLOBIN A1C
Est. average glucose Bld gHb Est-mCnc: 123 mg/dL
Hgb A1c MFr Bld: 5.9 % — ABNORMAL HIGH (ref 4.8–5.6)

## 2023-08-08 NOTE — Telephone Encounter (Signed)
 Delois Rosalynn (auntie) came by the office asking when will this rollator walker be sent to [facility] [place]. Call patient [phone] with an update.

## 2023-08-11 ENCOUNTER — Other Ambulatory Visit: Payer: Self-pay

## 2023-08-11 DIAGNOSIS — G629 Polyneuropathy, unspecified: Secondary | ICD-10-CM

## 2023-08-11 DIAGNOSIS — M160 Bilateral primary osteoarthritis of hip: Secondary | ICD-10-CM

## 2023-08-11 DIAGNOSIS — R269 Unspecified abnormalities of gait and mobility: Secondary | ICD-10-CM

## 2023-08-12 ENCOUNTER — Other Ambulatory Visit (HOSPITAL_COMMUNITY): Payer: Self-pay

## 2023-08-13 ENCOUNTER — Other Ambulatory Visit (HOSPITAL_COMMUNITY): Payer: Self-pay

## 2023-08-30 ENCOUNTER — Telehealth: Admitting: *Deleted

## 2023-08-30 ENCOUNTER — Encounter: Payer: Self-pay | Admitting: *Deleted

## 2023-08-30 NOTE — Patient Instructions (Signed)
 Graeme GRADE Awbrey - I am sorry I was unable to reach you today for our scheduled appointment. I work with Bevely Doffing, FNP and am calling to support your healthcare needs. Please contact me at [phone] at your earliest convenience. I look forward to speaking with you soon.   Thank you,  Rosina Forte, BSN RN [facility] RN Care Manager Direct Dial: [phone]  Fax: [phone]

## 2023-09-02 NOTE — Telephone Encounter (Signed)
 This patient was supposed to return in 12/2022 to be seen in the office prior to rescheduling her colonoscopy. Colonoscopy attempt in 09/2022 not successful due to poor prep.   She needs ov.

## 2023-09-06 ENCOUNTER — Ambulatory Visit: Payer: Self-pay

## 2023-09-09 ENCOUNTER — Encounter: Payer: Self-pay | Admitting: Radiology

## 2023-09-13 ENCOUNTER — Other Ambulatory Visit (HOSPITAL_COMMUNITY): Payer: Self-pay

## 2023-09-13 ENCOUNTER — Other Ambulatory Visit: Payer: Self-pay

## 2023-09-13 DIAGNOSIS — G621 Alcoholic polyneuropathy: Secondary | ICD-10-CM

## 2023-09-13 MED ORDER — PREGABALIN 150 MG PO CAPS
150.0000 mg | ORAL_CAPSULE | Freq: Two times a day (BID) | ORAL | 0 refills | Status: DC
  Filled 2023-09-13: qty 60, 30d supply, fill #0

## 2023-09-27 ENCOUNTER — Other Ambulatory Visit (HOSPITAL_COMMUNITY): Payer: Self-pay

## 2023-09-28 ENCOUNTER — Other Ambulatory Visit: Payer: Self-pay

## 2023-10-10 ENCOUNTER — Other Ambulatory Visit (HOSPITAL_COMMUNITY): Payer: Self-pay

## 2023-10-10 ENCOUNTER — Other Ambulatory Visit: Payer: Self-pay

## 2023-10-10 DIAGNOSIS — G621 Alcoholic polyneuropathy: Secondary | ICD-10-CM

## 2023-10-10 MED ORDER — PREGABALIN 150 MG PO CAPS
150.0000 mg | ORAL_CAPSULE | Freq: Two times a day (BID) | ORAL | 0 refills | Status: DC
  Filled 2023-10-10 – 2023-10-11 (×2): qty 60, 30d supply, fill #0

## 2023-10-11 ENCOUNTER — Ambulatory Visit (INDEPENDENT_AMBULATORY_CARE_PROVIDER_SITE_OTHER): Admitting: Gastroenterology

## 2023-10-11 ENCOUNTER — Other Ambulatory Visit: Payer: Self-pay

## 2023-10-11 ENCOUNTER — Encounter: Payer: Self-pay | Admitting: *Deleted

## 2023-10-11 ENCOUNTER — Encounter: Payer: Self-pay | Admitting: Gastroenterology

## 2023-10-11 ENCOUNTER — Telehealth: Payer: Self-pay | Admitting: Gastroenterology

## 2023-10-11 ENCOUNTER — Other Ambulatory Visit (HOSPITAL_COMMUNITY): Payer: Self-pay

## 2023-10-11 VITALS — BP 119/86 | HR 87 | Temp 98.4°F | Ht 65.0 in | Wt 267.6 lb

## 2023-10-11 DIAGNOSIS — K7030 Alcoholic cirrhosis of liver without ascites: Secondary | ICD-10-CM | POA: Diagnosis not present

## 2023-10-11 DIAGNOSIS — K824 Cholesterolosis of gallbladder: Secondary | ICD-10-CM | POA: Insufficient documentation

## 2023-10-11 DIAGNOSIS — F1011 Alcohol abuse, in remission: Secondary | ICD-10-CM

## 2023-10-11 DIAGNOSIS — Z1211 Encounter for screening for malignant neoplasm of colon: Secondary | ICD-10-CM

## 2023-10-11 MED ORDER — TRULANCE 3 MG PO TABS
3.0000 mg | ORAL_TABLET | Freq: Every day | ORAL | 5 refills | Status: DC
  Filled 2023-10-11: qty 30, 30d supply, fill #0

## 2023-10-11 NOTE — Telephone Encounter (Signed)
 Spoke to Delois (pt's aunt on dpr) gave her the appointment for US . She says she can't do that day. Gave her CS # to call to reschedule US .

## 2023-10-11 NOTE — Addendum Note (Signed)
 Addended by: EZZARD SONNY RAMAN on: 10/11/2023 11:05 PM   Modules accepted: Orders
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
## 2023-10-11 NOTE — Patient Instructions (Signed)
 We will call and get you scheduled for a colonoscopy soon.   Plan for labs and ultrasound of your liver and gallbladder.   We will be in touch with results as available.   Continue to avoid all alcohol.   Work on increasing physical activity, do chair exercises if you are unable to walk for long distances.  Eat low fat/low sugar foods.  Try to lose 10 percent of your body weight, about 25 pounds.   Do not eat raw seafood. This is dangerous for someone with liver cirrhosis.

## 2023-10-11 NOTE — Telephone Encounter (Signed)
 Patient seen today in the office. She has history of failed bowel prep.   Colonoscopy with Dr. Shaaron. ASA 3. Rm 1,2 -start Trulance  3mg  daily now, given RX today at OV -semaglutide , stop 7 days before -two days clear liquids -two days before, she will have miralax prep -one day before, she will complete short volume prep.  -day of prep, metformin  AM only -day of colonoscopy, hold metformin   -urine pregnancy test 3-5 days before, please remind patient

## 2023-10-12 ENCOUNTER — Other Ambulatory Visit: Payer: Self-pay

## 2023-10-12 ENCOUNTER — Other Ambulatory Visit (HOSPITAL_COMMUNITY): Payer: Self-pay

## 2023-10-12 DIAGNOSIS — Z1211 Encounter for screening for malignant neoplasm of colon: Secondary | ICD-10-CM | POA: Diagnosis not present

## 2023-10-12 DIAGNOSIS — K824 Cholesterolosis of gallbladder: Secondary | ICD-10-CM | POA: Diagnosis not present

## 2023-10-12 MED ORDER — NA SULFATE-K SULFATE-MG SULF 17.5-3.13-1.6 GM/177ML PO SOLN
1.0000 | Freq: Once | ORAL | 0 refills | Status: AC
  Filled 2023-10-12: qty 354, 1d supply, fill #0

## 2023-10-12 NOTE — Telephone Encounter (Signed)
 Spoke with patient. She has been scheduled for 10/9. Aware will mail her instructions to her and to call if she does not get in the mail. Rx for prep to be sent to pharmacy. She asked me to make sure I put all the meds she needs to hold on her instructions and the reminder for preg test too.

## 2023-10-12 NOTE — Addendum Note (Signed)
 Addended by: JEANELL GRAEME RAMAN on: 10/12/2023 09:42 AM   Modules accepted: Orders

## 2023-10-13 ENCOUNTER — Ambulatory Visit: Payer: Self-pay | Admitting: Gastroenterology

## 2023-10-13 LAB — COMPREHENSIVE METABOLIC PANEL WITH GFR
ALT: 12 IU/L (ref 0–32)
AST: 13 IU/L (ref 0–40)
Albumin: 4.1 g/dL (ref 3.8–4.9)
Alkaline Phosphatase: 62 IU/L (ref 49–135)
BUN/Creatinine Ratio: 13 (ref 9–23)
BUN: 10 mg/dL (ref 6–24)
Bilirubin Total: 1.2 mg/dL (ref 0.0–1.2)
CO2: 22 mmol/L (ref 20–29)
Calcium: 9.7 mg/dL (ref 8.7–10.2)
Chloride: 99 mmol/L (ref 96–106)
Creatinine, Ser: 0.79 mg/dL (ref 0.57–1.00)
Globulin, Total: 3.4 g/dL (ref 1.5–4.5)
Glucose: 99 mg/dL (ref 70–99)
Potassium: 4.3 mmol/L (ref 3.5–5.2)
Sodium: 137 mmol/L (ref 134–144)
Total Protein: 7.5 g/dL (ref 6.0–8.5)
eGFR: 89 mL/min/1.73 (ref >59)

## 2023-10-13 LAB — CBC WITH DIFFERENTIAL/PLATELET
Basophils Absolute: 0.1 x10E3/uL (ref 0.0–0.2)
Basos: 1 %
EOS (ABSOLUTE): 0.2 x10E3/uL (ref 0.0–0.4)
Eos: 2 %
Hematocrit: 39.8 % (ref 34.0–46.6)
Hemoglobin: 13.2 g/dL (ref 11.1–15.9)
Immature Grans (Abs): 0 x10E3/uL (ref 0.0–0.1)
Immature Granulocytes: 0 %
Lymphocytes Absolute: 3.3 x10E3/uL — ABNORMAL HIGH (ref 0.7–3.1)
Lymphs: 29 %
MCH: 30.5 pg (ref 26.6–33.0)
MCHC: 33.2 g/dL (ref 31.5–35.7)
MCV: 92 fL (ref 79–97)
Monocytes Absolute: 0.7 x10E3/uL (ref 0.1–0.9)
Monocytes: 7 %
Neutrophils Absolute: 7 x10E3/uL (ref 1.4–7.0)
Neutrophils: 61 %
Platelets: 322 x10E3/uL (ref 150–450)
RBC: 4.33 x10E6/uL (ref 3.77–5.28)
RDW: 12.4 % (ref 11.7–15.4)
WBC: 11.3 x10E3/uL — ABNORMAL HIGH (ref 3.4–10.8)

## 2023-10-13 LAB — AFP TUMOR MARKER: AFP, Serum, Tumor Marker: 3.6 ng/mL (ref 0.0–9.2)

## 2023-10-13 LAB — PROTIME-INR
INR: 1.1 (ref 0.9–1.2)
Prothrombin Time: 11.7 s (ref 9.1–12.0)

## 2023-10-17 ENCOUNTER — Telehealth: Payer: Self-pay | Admitting: *Deleted

## 2023-10-17 NOTE — Telephone Encounter (Signed)
 Received message from Anitra in endo procedure 10/27/23 needs to reschedule Called pt, LMOVM to call back  Per prior notes    10/11/23  9:54 PM Note Patient seen today in the office. She has history of failed bowel prep.    Colonoscopy with Dr. Shaaron. ASA 3. Rm 1,2 -start Trulance  3mg  daily now, given RX today at OV -semaglutide , stop 7 days before -two days clear liquids -two days before, she will have miralax prep -one day before, she will complete short volume prep.  -day of prep, metformin  AM only -day of colonoscopy, hold metformin   -urine pregnancy test 3-5 days before, please remind patient

## 2023-10-19 NOTE — Telephone Encounter (Signed)
 Spoke with pt. She has been rescheduled to 10/17. She will pick up her new instructions from the office at [facility]. She still has her prep as she never mixed it.

## 2023-10-20 ENCOUNTER — Ambulatory Visit (HOSPITAL_COMMUNITY)
Admission: RE | Admit: 2023-10-20 | Discharge: 2023-10-20 | Disposition: A | Discharge status: Home / Self Care | Source: Ambulatory Visit | Attending: Gastroenterology | Admitting: Gastroenterology

## 2023-10-20 DIAGNOSIS — K824 Cholesterolosis of gallbladder: Secondary | ICD-10-CM | POA: Insufficient documentation

## 2023-10-20 DIAGNOSIS — Z1211 Encounter for screening for malignant neoplasm of colon: Secondary | ICD-10-CM | POA: Insufficient documentation

## 2023-10-20 DIAGNOSIS — K7030 Alcoholic cirrhosis of liver without ascites: Secondary | ICD-10-CM | POA: Insufficient documentation

## 2023-10-20 DIAGNOSIS — K7460 Unspecified cirrhosis of liver: Secondary | ICD-10-CM | POA: Diagnosis not present

## 2023-10-21 ENCOUNTER — Other Ambulatory Visit (HOSPITAL_COMMUNITY)

## 2023-10-21 ENCOUNTER — Other Ambulatory Visit: Payer: Self-pay | Admitting: *Deleted

## 2023-10-21 DIAGNOSIS — K824 Cholesterolosis of gallbladder: Secondary | ICD-10-CM

## 2023-10-27 ENCOUNTER — Encounter (HOSPITAL_COMMUNITY): Admission: RE | Payer: Self-pay | Source: Home / Self Care

## 2023-10-27 ENCOUNTER — Ambulatory Visit (HOSPITAL_COMMUNITY): Admission: RE | Admit: 2023-10-27 | Source: Home / Self Care | Admitting: Internal Medicine

## 2023-10-27 SURGERY — COLONOSCOPY
Anesthesia: Choice

## 2023-10-28 ENCOUNTER — Ambulatory Visit

## 2023-10-31 ENCOUNTER — Other Ambulatory Visit (HOSPITAL_COMMUNITY)
Admission: RE | Admit: 2023-10-31 | Discharge: 2023-10-31 | Disposition: A | Discharge status: Home / Self Care | Source: Ambulatory Visit | Attending: Gastroenterology | Admitting: Gastroenterology

## 2023-10-31 DIAGNOSIS — K7030 Alcoholic cirrhosis of liver without ascites: Secondary | ICD-10-CM | POA: Insufficient documentation

## 2023-10-31 DIAGNOSIS — K824 Cholesterolosis of gallbladder: Secondary | ICD-10-CM | POA: Insufficient documentation

## 2023-10-31 DIAGNOSIS — Z1211 Encounter for screening for malignant neoplasm of colon: Secondary | ICD-10-CM | POA: Insufficient documentation

## 2023-10-31 LAB — PREGNANCY, URINE: Preg Test, Ur: NEGATIVE

## 2023-11-01 ENCOUNTER — Ambulatory Visit

## 2023-11-04 ENCOUNTER — Ambulatory Visit (HOSPITAL_COMMUNITY): Admitting: Anesthesiology

## 2023-11-04 ENCOUNTER — Encounter (HOSPITAL_COMMUNITY): Payer: Self-pay | Admitting: Internal Medicine

## 2023-11-04 ENCOUNTER — Ambulatory Visit (HOSPITAL_COMMUNITY)
Admission: RE | Admit: 2023-11-04 | Discharge: 2023-11-04 | Disposition: A | Discharge status: Home / Self Care | Attending: Internal Medicine | Admitting: Internal Medicine

## 2023-11-04 ENCOUNTER — Encounter (HOSPITAL_COMMUNITY)
Admission: RE | Disposition: A | Discharge status: Home / Self Care | Payer: Self-pay | Source: Home / Self Care | Attending: Internal Medicine

## 2023-11-04 ENCOUNTER — Other Ambulatory Visit: Payer: Self-pay

## 2023-11-04 DIAGNOSIS — Z87891 Personal history of nicotine dependence: Secondary | ICD-10-CM | POA: Insufficient documentation

## 2023-11-04 DIAGNOSIS — Z6841 Body Mass Index (BMI) 40.0 and over, adult: Secondary | ICD-10-CM | POA: Insufficient documentation

## 2023-11-04 DIAGNOSIS — I10 Essential (primary) hypertension: Secondary | ICD-10-CM | POA: Insufficient documentation

## 2023-11-04 DIAGNOSIS — D649 Anemia, unspecified: Secondary | ICD-10-CM | POA: Diagnosis not present

## 2023-11-04 DIAGNOSIS — Z7984 Long term (current) use of oral hypoglycemic drugs: Secondary | ICD-10-CM | POA: Insufficient documentation

## 2023-11-04 DIAGNOSIS — Z1211 Encounter for screening for malignant neoplasm of colon: Secondary | ICD-10-CM | POA: Insufficient documentation

## 2023-11-04 DIAGNOSIS — R569 Unspecified convulsions: Secondary | ICD-10-CM

## 2023-11-04 DIAGNOSIS — E119 Type 2 diabetes mellitus without complications: Secondary | ICD-10-CM | POA: Diagnosis not present

## 2023-11-04 DIAGNOSIS — E6689 Other obesity not elsewhere classified: Secondary | ICD-10-CM | POA: Insufficient documentation

## 2023-11-04 HISTORY — PX: COLONOSCOPY: SHX5424

## 2023-11-04 LAB — GLUCOSE, CAPILLARY: Glucose-Capillary: 76 mg/dL (ref 70–99)

## 2023-11-04 SURGERY — COLONOSCOPY
Anesthesia: General

## 2023-11-04 MED ORDER — LIDOCAINE HCL (CARDIAC) PF 100 MG/5ML IV SOSY
PREFILLED_SYRINGE | INTRAVENOUS | Status: DC | PRN
  Administered 2023-11-04: 50 mg via INTRAVENOUS

## 2023-11-04 MED ORDER — LACTATED RINGERS IV SOLN: INTRAVENOUS | Status: DC

## 2023-11-04 MED ADMIN — PROPOFOL 200 MG/20ML IV EMUL: 125 ug/kg/min | INTRAVENOUS | NDC 00069020910

## 2023-11-04 MED ADMIN — PROPOFOL 200 MG/20ML IV EMUL: 100 mg | INTRAVENOUS | NDC 00069020910

## 2023-11-04 NOTE — Anesthesia Preprocedure Evaluation (Addendum)
 Anesthesia Evaluation  Patient identified by MRN, date of birth, ID band Patient awake    Reviewed: Allergy & Precautions, H&P , NPO status , Patient's Chart, lab work & pertinent test results, reviewed documented beta blocker date and time   Airway Mallampati: II  TM Distance: >3 FB Neck ROM: full    Dental no notable dental hx.    Pulmonary former smoker   Pulmonary exam normal breath sounds clear to auscultation       Cardiovascular Exercise Tolerance: Good hypertension,  Rhythm:regular Rate:Normal     Neuro/Psych Seizures -,  PSYCHIATRIC DISORDERS    Schizophrenia   Neuromuscular disease    GI/Hepatic negative GI ROS, Neg liver ROS,,,  Endo/Other  diabetes  Class 4 obesity  Renal/GU negative Renal ROS  negative genitourinary   Musculoskeletal   Abdominal   Peds  Hematology  (+) Blood dyscrasia, anemia   Anesthesia Other Findings   Reproductive/Obstetrics negative OB ROS                              Anesthesia Physical Anesthesia Plan  ASA: 3  Anesthesia Plan: General   Post-op Pain Management:    Induction:   PONV Risk Score and Plan: Propofol  infusion  Airway Management Planned:   Additional Equipment:   Intra-op Plan:   Post-operative Plan:   Informed Consent: I have reviewed the patients History and Physical, chart, labs and discussed the procedure including the risks, benefits and alternatives for the proposed anesthesia with the patient or authorized representative who has indicated his/her understanding and acceptance.     Dental Advisory Given  Plan Discussed with: CRNA  Anesthesia Plan Comments:          Anesthesia Quick Evaluation

## 2023-11-04 NOTE — Discharge Instructions (Signed)
  Colonoscopy Discharge Instructions  Read the instructions outlined below and refer to this sheet in the next few weeks. These discharge instructions provide you with general information on caring for yourself after you leave the hospital. Your doctor may also give you specific instructions. While your treatment has been planned according to the most current medical practices available, unavoidable complications occasionally occur. If you have any problems or questions after discharge, call Dr. Shaaron at [phone]. ACTIVITY You may resume your regular activity, but move at a slower pace for the next 24 hours.  Take frequent rest periods for the next 24 hours.  Walking will help get rid of the air and reduce the bloated feeling in your belly (abdomen).  No driving for 24 hours (because of the medicine (anesthesia) used during the test).   Do not sign any important legal documents or operate any machinery for 24 hours (because of the anesthesia used during the test).  NUTRITION Drink plenty of fluids.  You may resume your normal diet as instructed by your doctor.  Begin with a light meal and progress to your normal diet. Heavy or fried foods are harder to digest and may make you feel sick to your stomach (nauseated).  Avoid alcoholic beverages for 24 hours or as instructed.  MEDICATIONS You may resume your normal medications unless your doctor tells you otherwise.  WHAT YOU CAN EXPECT TODAY Some feelings of bloating in the abdomen.  Passage of more gas than usual.  Spotting of blood in your stool or on the toilet paper.  IF YOU HAD POLYPS REMOVED DURING THE COLONOSCOPY: No aspirin products for 7 days or as instructed.  No alcohol for 7 days or as instructed.  Eat a soft diet for the next 24 hours.  FINDING OUT THE RESULTS OF YOUR TEST Not all test results are available during your visit. If your test results are not back during the visit, make an appointment with your caregiver to find out the  results. Do not assume everything is normal if you have not heard from your caregiver or the medical facility. It is important for you to follow up on all of your test results.  SEEK IMMEDIATE MEDICAL ATTENTION IF: You have more than a spotting of blood in your stool.  Your belly is swollen (abdominal distention).  You are nauseated or vomiting.  You have a temperature over 101.  You have abdominal pain or discomfort that is severe or gets worse throughout the day.       No polyps found today.  Recommend repeat screening colonoscopy in 10 years
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
## 2023-11-04 NOTE — Transfer of Care (Signed)
 Immediate Anesthesia Transfer of Care Note  Patient: Melinda Giles  Procedure(s) Performed: COLONOSCOPY  Patient Location: Endoscopy Unit  Anesthesia Type:General  Level of Consciousness: awake, alert , and patient cooperative  Airway & Oxygen Therapy: Patient Spontanous Breathing  Post-op Assessment: Report given to RN and Post -op Vital signs reviewed and stable  Post vital signs: Reviewed and stable  Last Vitals:  Vitals Value Taken Time  BP 112/76   Temp 98.7   Pulse 68   Resp 16   SpO2 98     Last Pain:  Vitals:   11/04/23 1329  TempSrc:   PainSc: 0-No pain      Patients Stated Pain Goal: 5 (11/04/23 1127)  Complications: No notable events documented.

## 2023-11-04 NOTE — Op Note (Signed)
 [facility] Patient Name: Melinda Giles Procedure Date: 11/04/2023 1:19 PM MRN: [id number] Date of Birth: [birth year] Attending MD: Lamar Ozell Hollingshead , MD, [id number] CSN: [id number] Age: 53 Admit Type: Ambulatory Procedure:                Colonoscopy Indications:              Screening for colorectal malignant neoplasm Providers:                Lamar Ozell Hollingshead, MD, Devere Lodge, Gordy Lonni Balm, Technician Referring MD:              Medicines:                Propofol  per Anesthesia Complications:            No immediate complications. Estimated Blood Loss:     Estimated blood loss: none. Procedure:                Pre-Anesthesia Assessment:                           - Prior to the procedure, a History and Physical                            was performed, and patient medications and                            allergies were reviewed. The patient's tolerance of                            previous anesthesia was also reviewed. The risks                            and benefits of the procedure and the sedation                            options and risks were discussed with the patient.                            All questions were answered, and informed consent                            was obtained. Prior Anticoagulants: The patient has                            taken no anticoagulant or antiplatelet agents. ASA                            Grade Assessment: III - A patient with severe                            systemic disease. After reviewing the risks and  benefits, the patient was deemed in satisfactory                            condition to undergo the procedure.                           After obtaining informed consent, the colonoscope                            was passed under direct vision. Throughout the                            procedure, the patient's blood pressure, pulse, and                             oxygen saturations were monitored continuously. The                            [id number] ([id number]) Colon was introduced through                            the anus and advanced to the the cecum, identified                            by appendiceal orifice and ileocecal valve. The                            colonoscopy was performed without difficulty. The                            patient tolerated the procedure well. The quality                            of the bowel preparation was adequate. The                            ileocecal valve, appendiceal orifice, and rectum                            were photographed. Scope In: 1:34:27 PM Scope Out: 1:44:24 PM Scope Withdrawal Time: 0 hours 7 minutes 3 seconds  Total Procedure Duration: 0 hours 9 minutes 57 seconds  Findings:      The perianal and digital rectal examinations were normal.      The colon (entire examined portion) appeared normal.      The retroflexed view of the distal rectum and anal verge was normal and       showed no anal or rectal abnormalities. Impression:               - The entire examined colon is normal.                           - The distal rectum and anal verge are normal on  retroflexion view.                           - No specimens collected. Moderate Sedation:      Moderate (conscious) sedation was personally administered by an       anesthesia professional. The following parameters were monitored: oxygen       saturation, heart rate, blood pressure, respiratory rate, EKG, adequacy       of pulmonary ventilation, and response to care. Recommendation:           - Repeat colonoscopy in 10 years for screening                            purposes.                           - Return to GI office (date not yet determined). Procedure Code(s):        --- Professional ---                           [phone], Colonoscopy, flexible; diagnostic, including                             collection of specimen(s) by brushing or washing,                            when performed (separate procedure) Diagnosis Code(s):        --- Professional ---                           Z12.11, Encounter for screening for malignant                            neoplasm of colon CPT copyright 2022 American Medical Association. All rights reserved. The codes documented in this report are preliminary and upon coder review may  be revised to meet current compliance requirements. Lamar HERO. Legrand Lasser, MD Lamar Ozell Hollingshead, MD 11/04/2023 1:54:14 PM This report has been signed electronically. Number of Addenda: 0

## 2023-11-07 NOTE — Anesthesia Postprocedure Evaluation (Signed)
 Anesthesia Post Note  Patient: Melinda Giles  Procedure(s) Performed: COLONOSCOPY  Patient location during evaluation: Phase II Anesthesia Type: General Level of consciousness: awake Pain management: pain level controlled Vital Signs Assessment: post-procedure vital signs reviewed and stable Respiratory status: spontaneous breathing and respiratory function stable Cardiovascular status: blood pressure returned to baseline and stable Postop Assessment: no headache and no apparent nausea or vomiting Anesthetic complications: no Comments: Late entry   No notable events documented.   Last Vitals:  Vitals:   11/04/23 1127 11/04/23 1347  BP: (!) 139/92 118/73  Pulse: 81 95  Resp: 16 20  Temp:  36.5 C  SpO2: 94% 100%    Last Pain:  Vitals:   11/04/23 1347  TempSrc: Oral  PainSc: 0-No pain                 Yvonna JINNY Bosworth

## 2023-11-08 ENCOUNTER — Encounter (HOSPITAL_COMMUNITY): Payer: Self-pay | Admitting: Internal Medicine

## 2023-11-12 ENCOUNTER — Other Ambulatory Visit: Payer: Self-pay

## 2023-11-12 DIAGNOSIS — G621 Alcoholic polyneuropathy: Secondary | ICD-10-CM

## 2023-11-14 ENCOUNTER — Other Ambulatory Visit: Payer: Self-pay

## 2023-11-14 ENCOUNTER — Other Ambulatory Visit (HOSPITAL_COMMUNITY): Payer: Self-pay

## 2023-11-14 DIAGNOSIS — E1165 Type 2 diabetes mellitus with hyperglycemia: Secondary | ICD-10-CM

## 2023-11-14 MED ORDER — OZEMPIC (1 MG/DOSE) 4 MG/3ML ~~LOC~~ SOPN
1.0000 mg | PEN_INJECTOR | SUBCUTANEOUS | 1 refills | Status: DC
  Filled 2023-11-14: qty 3, 28d supply, fill #0

## 2023-11-14 MED ORDER — PREGABALIN 150 MG PO CAPS
150.0000 mg | ORAL_CAPSULE | Freq: Two times a day (BID) | ORAL | 0 refills | Status: DC
  Filled 2023-11-14: qty 60, 30d supply, fill #0

## 2023-11-15 ENCOUNTER — Ambulatory Visit: Admitting: General Surgery

## 2023-11-15 ENCOUNTER — Encounter: Payer: Self-pay | Admitting: General Surgery

## 2023-11-15 VITALS — BP 113/82 | HR 80 | Temp 98.2°F | Resp 14 | Ht 64.0 in | Wt 263.0 lb

## 2023-11-15 DIAGNOSIS — K824 Cholesterolosis of gallbladder: Secondary | ICD-10-CM
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
## 2023-11-21 ENCOUNTER — Encounter: Payer: Self-pay | Admitting: Radiology

## 2023-11-25 NOTE — Patient Instructions (Signed)
 Melinda Giles  11/25/2023     @PREFPERIOPPHARMACY @   Your procedure is scheduled on  11/30/2023.   Report to [facility] at  0600  A.M.    Call this number if you have problems the morning of surgery:  [phone]  If you experience any cold or flu symptoms such as cough, fever, chills, shortness of breath, etc. between now and your scheduled surgery, please notify us  at the above number.   Remember:         Your last dose of semaglutide  soul be on 11/22/2023.        DO NOT take any medications for diabetes the morning of your procedure.   Do not eat after midnight.   You may drink clear liquids until 0330 am on 11/30/2023.     Clear liquids allowed are:                    Water, Carbonated beverages (diabetics please choose diet or no sugar options), Black Coffee Only (No creamer, milk or cream, including half & half and powdered creamer), and Clear Sports drink (No red color; diabetics please choose diet or no sugar options)    Take these medicines the morning of surgery with A SIP OF WATER                                         metoprolol , lyrica .    Do not wear jewelry, make-up or nail polish, including gel polish,  artificial nails, or any other type of covering on natural nails (fingers and  toes).  Do not wear lotions, powders, or perfumes, or deodorant.  Do not shave 48 hours prior to surgery.  Men may shave face and neck.  Do not bring valuables to the hospital.  [facility] is not responsible for any belongings or valuables.  Contacts, dentures or bridgework may not be worn into surgery.  Leave your suitcase in the car.  After surgery it may be brought to your room.  For patients admitted to the hospital, discharge time will be determined by your treatment team.  Patients discharged the day of surgery will not be allowed to drive home and must have someone with them for 24 hours.    Special instructions:  DO NOT smoke tobacco or vape for 24  hours before your procedure.  Please read over the following fact sheets that you were given. Coughing and Deep Breathing, Anesthesia Post-op Instructions, and Care and Recovery After Surgery      Minimally Invasive Cholecystectomy, Care After The following information offers guidance on how to care for yourself after your procedure. Your health care provider may also give you more specific instructions. If you have problems or questions, contact your health care provider. What can I expect after the procedure? After the procedure, it is common to have: Pain at your incision sites. You will be given medicines to control this pain. Mild nausea or vomiting. Bloating and possible shoulder pain from the gas that was used during the procedure. Follow these instructions at home: Medicines Take over-the-counter and prescription medicines only as told by your health care provider. If you were prescribed an antibiotic medicine, take it as told by your health care provider. Do not stop using the antibiotic even if you start to feel better. Ask your health care provider  if the medicine prescribed to you: Requires you to avoid driving or using machinery. Can cause constipation. You may need to take these actions to prevent or treat constipation: Drink enough fluid to keep your urine pale yellow. Take over-the-counter or prescription medicines. Eat foods that are high in fiber, such as beans, whole grains, and fresh fruits and vegetables. Limit foods that are high in fat and processed sugars, such as fried or sweet foods. Incision care  Follow instructions from your health care provider about how to take care of your incisions. Make sure you: Wash your hands with soap and water for at least 20 seconds before and after you change your bandage (dressing). If soap and water are not available, use hand sanitizer. Change your dressing as told by your health care provider. Leave stitches (sutures), skin  glue, or adhesive strips in place. These skin closures may need to be in place for 2 weeks or longer. If adhesive strip edges start to loosen and curl up, you may trim the loose edges. Do not remove adhesive strips completely unless your health care provider tells you to do that. Do not take baths, swim, or use a hot tub until your health care provider approves. Ask your health care provider if you may take showers. You may only be allowed to take sponge baths. Check your incision area every day for signs of infection. Check for: More redness, swelling, or pain. Fluid or blood. Warmth. Pus or a bad smell. Activity Rest as told by your health care provider. Do not do activities that require a lot of effort. Avoid sitting for a long time without moving. Get up to take short walks every 1-2 hours. This is important to improve blood flow and breathing. Ask for help if you feel weak or unsteady. Do not lift anything that is heavier than 10 lb (4.5 kg), or the limit that you are told, until your health care provider says that it is safe. Do not play contact sports until your health care provider approves. Do not return to work or school until your health care provider approves. Return to your normal activities as told by your health care provider. Ask your health care provider what activities are safe for you. General instructions If you were given a sedative during the procedure, it can affect you for several hours. Do not drive or operate machinery until your health care provider says that it is safe. Keep all follow-up visits. This is important. Contact a health care provider if: You develop a rash. You have more redness, swelling, or pain around your incisions. You have fluid or blood coming from your incisions. Your incisions feel warm to the touch. You have pus or a bad smell coming from your incisions. You have a fever. One or more of your incisions breaks open. Get help right away if: You  have trouble breathing. You have chest pain. You have more pain in your shoulders. You faint or feel dizzy when you stand. You have severe pain in your abdomen. You have nausea or vomiting that lasts for more than one day. You have leg pain that is new or unusual, or if it is localized to one specific spot. These symptoms may represent a serious problem that is an emergency. Do not wait to see if the symptoms will go away. Get medical help right away. Call your local emergency services (911 in the [place] Do not drive yourself to the hospital. Summary After your procedure, it is  common to have pain at the incision sites. You may also have nausea or bloating. Follow your health care provider's instructions about medicine, activity restrictions, and caring for your incision areas. Do not do activities that require a lot of effort. Contact a health care provider if you have a fever or other signs of infection, such as more redness, swelling, or pain around the incisions. Get help right away if you have chest pain, increasing pain in the shoulders, or trouble breathing. This information is not intended to replace advice given to you by your health care provider. Make sure you discuss any questions you have with your health care provider. Document Revised: 07/07/2020 Document Reviewed: 07/08/2020 Elsevier Patient Education  2024 [employer].General Anesthesia, Adult, Care After The following information offers guidance on how to care for yourself after your procedure. Your health care provider may also give you more specific instructions. If you have problems or questions, contact your health care provider. What can I expect after the procedure? After the procedure, it is common for people to: Have pain or discomfort at the IV site. Have nausea or vomiting. Have a sore throat or hoarseness. Have trouble concentrating. Feel cold or chills. Feel weak, sleepy, or tired (fatigue). Have soreness and  body aches. These can affect parts of the body that were not involved in surgery. Follow these instructions at home: For the time period you were told by your health care provider:  Rest. Do not participate in activities where you could fall or become injured. Do not drive or use machinery. Do not drink alcohol. Do not take sleeping pills or medicines that cause drowsiness. Do not make important decisions or sign legal documents. Do not take care of children on your own. General instructions Drink enough fluid to keep your urine pale yellow. If you have sleep apnea, surgery and certain medicines can increase your risk for breathing problems. Follow instructions from your health care provider about wearing your sleep device: Anytime you are sleeping, including during daytime naps. While taking prescription pain medicines, sleeping medicines, or medicines that make you drowsy. Return to your normal activities as told by your health care provider. Ask your health care provider what activities are safe for you. Take over-the-counter and prescription medicines only as told by your health care provider. Do not use any products that contain nicotine or tobacco. These products include cigarettes, chewing tobacco, and vaping devices, such as e-cigarettes. These can delay incision healing after surgery. If you need help quitting, ask your health care provider. Contact a health care provider if: You have nausea or vomiting that does not get better with medicine. You vomit every time you eat or drink. You have pain that does not get better with medicine. You cannot urinate or have bloody urine. You develop a skin rash. You have a fever. Get help right away if: You have trouble breathing. You have chest pain. You vomit blood. These symptoms may be an emergency. Get help right away. Call 911. Do not wait to see if the symptoms will go away. Do not drive yourself to the hospital. Summary After the  procedure, it is common to have a sore throat, hoarseness, nausea, vomiting, or to feel weak, sleepy, or fatigue. For the time period you were told by your health care provider, do not drive or use machinery. Get help right away if you have difficulty breathing, have chest pain, or vomit blood. These symptoms may be an emergency. This information is not intended  to replace advice given to you by your health care provider. Make sure you discuss any questions you have with your health care provider. Document Revised: 04/03/2021 Document Reviewed: 04/03/2021 Elsevier Patient Education  2024 Else[employer] Inc.How to Use Chlorhexidine at Home in the Shower Chlorhexidine gluconate (CHG) is a germ-killing (antiseptic) wash that's used to clean the skin. It can get rid of the germs that normally live on the skin and can keep them away for about 24 hours. If you're having surgery, you may be told to shower with CHG at home the night before surgery. This can help lower your risk for infection. To use CHG wash in the shower, follow the steps below. Supplies needed: CHG body wash. Clean washcloth. Clean towel. How to use CHG in the shower Follow these steps unless you're told to use CHG in a different way: Start the shower. Use your normal soap and shampoo to wash your face and hair. Turn off the shower or move out of the shower stream. Pour CHG onto a clean washcloth. Do not use any type of brush or rough sponge. Start at your neck, washing your body down to your toes. Make sure you: Wash the part of your body where the surgery will be done for at least 1 minute. Do not scrub. Do not use CHG on your head or face unless your health care provider tells you to. If it gets into your ears or eyes, rinse them well with water. Do not wash your genitals with CHG. Wash your back and under your arms. Make sure to wash skin folds. Let the CHG sit on your skin for 1-2 minutes or as long as told. Rinse your entire body  in the shower, including all body creases and folds. Turn off the shower. Dry off with a clean towel. Do not put anything on your skin afterward, such as powder, lotion, or perfume. Put on clean clothes or pajamas. If it's the night before surgery, sleep in clean sheets. General tips Use CHG only as told, and follow the instructions on the label. Use the full amount of CHG as told. This is often one bottle. Do not smoke and stay away from flames after using CHG. Your skin may feel sticky after using CHG. This is normal. The sticky feeling will go away as the CHG dries. Do not use CHG: If you have a chlorhexidine allergy or have reacted to chlorhexidine in the past. On open wounds or areas of skin that have broken skin, cuts, or scrapes. On babies younger than 58 months of age. Contact a health care provider if: You have questions about using CHG. Your skin gets irritated or itchy. You have a rash after using CHG. You swallow any CHG. Call your local poison control center [phone] in the [place] Your eyes itch badly, or they become very red or swollen. Your hearing changes. You have trouble seeing. If you can't reach your provider, go to an urgent care or emergency room. Do not drive yourself. Get help right away if: You have swelling or tingling in your mouth or throat. You make high-pitched whistling sounds when you breathe, most often when you breathe out (wheeze). You have trouble breathing. These symptoms may be an emergency. Call 911 right away. Do not wait to see if the symptoms will go away. Do not drive yourself to the hospital. This information is not intended to replace advice given to you by your health care provider. Make sure you discuss any questions  you have with your health care provider. Document Revised: 07/20/2022 Document Reviewed: 07/16/2021 Elsevier Patient Education  2024 [employer].

## 2023-11-28 ENCOUNTER — Encounter (HOSPITAL_COMMUNITY)
Admission: RE | Admit: 2023-11-28 | Discharge: 2023-11-28 | Disposition: A | Discharge status: Home / Self Care | Source: Ambulatory Visit | Attending: General Surgery | Admitting: General Surgery

## 2023-11-28 DIAGNOSIS — Z01818 Encounter for other preprocedural examination: Secondary | ICD-10-CM | POA: Diagnosis not present

## 2023-11-28 DIAGNOSIS — E119 Type 2 diabetes mellitus without complications: Secondary | ICD-10-CM | POA: Diagnosis not present

## 2023-11-28 DIAGNOSIS — R9431 Abnormal electrocardiogram [ECG] [EKG]: Secondary | ICD-10-CM | POA: Diagnosis not present

## 2023-11-28 DIAGNOSIS — Z01812 Encounter for preprocedural laboratory examination: Secondary | ICD-10-CM | POA: Diagnosis present

## 2023-11-28 DIAGNOSIS — Z01810 Encounter for preprocedural cardiovascular examination: Secondary | ICD-10-CM | POA: Diagnosis present

## 2023-11-28 LAB — POCT PREGNANCY, URINE: Preg Test, Ur: NEGATIVE

## 2023-11-30 ENCOUNTER — Ambulatory Visit (HOSPITAL_COMMUNITY): Admitting: Anesthesiology

## 2023-11-30 ENCOUNTER — Ambulatory Visit (HOSPITAL_COMMUNITY)
Admission: RE | Admit: 2023-11-30 | Discharge: 2023-11-30 | Disposition: A | Discharge status: Home / Self Care | Attending: General Surgery | Admitting: General Surgery

## 2023-11-30 ENCOUNTER — Encounter (HOSPITAL_COMMUNITY)
Admission: RE | Disposition: A | Discharge status: Home / Self Care | Payer: Self-pay | Source: Home / Self Care | Attending: General Surgery

## 2023-11-30 DIAGNOSIS — Z87891 Personal history of nicotine dependence: Secondary | ICD-10-CM | POA: Diagnosis not present

## 2023-11-30 DIAGNOSIS — E6689 Other obesity not elsewhere classified: Secondary | ICD-10-CM | POA: Diagnosis not present

## 2023-11-30 DIAGNOSIS — K824 Cholesterolosis of gallbladder: Secondary | ICD-10-CM | POA: Diagnosis not present

## 2023-11-30 DIAGNOSIS — K8010 Calculus of gallbladder with chronic cholecystitis without obstruction: Secondary | ICD-10-CM | POA: Insufficient documentation

## 2023-11-30 DIAGNOSIS — K811 Chronic cholecystitis: Secondary | ICD-10-CM | POA: Diagnosis present

## 2023-11-30 DIAGNOSIS — Z7985 Long-term (current) use of injectable non-insulin antidiabetic drugs: Secondary | ICD-10-CM | POA: Diagnosis not present

## 2023-11-30 DIAGNOSIS — E119 Type 2 diabetes mellitus without complications: Secondary | ICD-10-CM | POA: Diagnosis not present

## 2023-11-30 DIAGNOSIS — Z6841 Body Mass Index (BMI) 40.0 and over, adult: Secondary | ICD-10-CM | POA: Insufficient documentation

## 2023-11-30 DIAGNOSIS — I10 Essential (primary) hypertension: Secondary | ICD-10-CM

## 2023-11-30 DIAGNOSIS — Z7984 Long term (current) use of oral hypoglycemic drugs: Secondary | ICD-10-CM | POA: Insufficient documentation

## 2023-11-30 DIAGNOSIS — K7460 Unspecified cirrhosis of liver: Secondary | ICD-10-CM | POA: Diagnosis not present

## 2023-11-30 LAB — GLUCOSE, CAPILLARY: Glucose-Capillary: 157 mg/dL — ABNORMAL HIGH (ref 70–99)

## 2023-11-30 SURGERY — CHOLECYSTECTOMY, ROBOT-ASSISTED, LAPAROSCOPIC
Anesthesia: General | Site: Abdomen

## 2023-11-30 MED ORDER — SUGAMMADEX SODIUM 200 MG/2ML IV SOLN
INTRAVENOUS | Status: DC | PRN
  Administered 2023-11-30: 200 mg via INTRAVENOUS

## 2023-11-30 MED ORDER — ONDANSETRON HCL 4 MG/2ML IJ SOLN: 4.0000 mg | Freq: Once | INTRAMUSCULAR | Status: DC | PRN

## 2023-11-30 MED ORDER — LACTATED RINGERS IV SOLN
INTRAVENOUS | Status: DC | PRN
Start: 2023-11-30 — End: 2023-11-30

## 2023-11-30 MED ORDER — CHLORHEXIDINE GLUCONATE CLOTH 2 % EX PADS: 6.0000 | MEDICATED_PAD | Freq: Once | CUTANEOUS | Status: DC

## 2023-11-30 MED ORDER — CEFAZOLIN SODIUM-DEXTROSE 1-4 GM/50ML-% IV SOLN
INTRAVENOUS | Status: AC
  Filled 2023-11-30: qty 50

## 2023-11-30 MED ORDER — FENTANYL CITRATE (PF) 50 MCG/ML IJ SOSY
25.0000 ug | PREFILLED_SYRINGE | INTRAMUSCULAR | Status: DC | PRN
  Administered 2023-11-30: 50 ug via INTRAVENOUS
  Filled 2023-11-30: qty 1

## 2023-11-30 MED ORDER — MIDAZOLAM HCL 2 MG/2ML IJ SOLN
INTRAMUSCULAR | Status: AC
  Filled 2023-11-30: qty 2

## 2023-11-30 MED ORDER — FENTANYL CITRATE (PF) 100 MCG/2ML IJ SOLN
INTRAMUSCULAR | Status: AC
  Filled 2023-11-30: qty 2

## 2023-11-30 MED ORDER — BUPIVACAINE HCL (PF) 0.5 % IJ SOLN
INTRAMUSCULAR | Status: DC | PRN
  Administered 2023-11-30: 30 mL

## 2023-11-30 MED ORDER — ORAL CARE MOUTH RINSE: 15.0000 mL | Freq: Once | OROMUCOSAL | Status: DC

## 2023-11-30 MED ORDER — ROCURONIUM BROMIDE 10 MG/ML (PF) SYRINGE
PREFILLED_SYRINGE | INTRAVENOUS | Status: DC | PRN
  Administered 2023-11-30: 50 mg via INTRAVENOUS

## 2023-11-30 MED ORDER — HEMOSTATIC AGENTS (NO CHARGE) OPTIME
TOPICAL | Status: DC | PRN
  Administered 2023-11-30: 1 via TOPICAL

## 2023-11-30 MED ORDER — BUPIVACAINE HCL (PF) 0.5 % IJ SOLN
INTRAMUSCULAR | Status: AC
  Filled 2023-11-30: qty 30

## 2023-11-30 MED ORDER — PROPOFOL 10 MG/ML IV BOLUS
INTRAVENOUS | Status: AC
  Filled 2023-11-30: qty 20

## 2023-11-30 MED ORDER — ONDANSETRON HCL 4 MG/2ML IJ SOLN
INTRAMUSCULAR | Status: AC
  Filled 2023-11-30: qty 2

## 2023-11-30 MED ORDER — ONDANSETRON HCL 4 MG/2ML IJ SOLN
INTRAMUSCULAR | Status: DC | PRN
  Administered 2023-11-30: 4 mg via INTRAVENOUS

## 2023-11-30 MED ORDER — OXYCODONE HCL 5 MG PO TABS: 5.0000 mg | ORAL_TABLET | Freq: Once | ORAL | Status: DC | PRN

## 2023-11-30 MED ORDER — PROPOFOL 10 MG/ML IV BOLUS
INTRAVENOUS | Status: AC
Start: 2023-11-30 — End: 2023-11-30
  Filled 2023-11-30: qty 20

## 2023-11-30 MED ORDER — PROPOFOL 10 MG/ML IV BOLUS
INTRAVENOUS | Status: DC | PRN
  Administered 2023-11-30: 200 mg via INTRAVENOUS

## 2023-11-30 MED ORDER — STERILE WATER FOR IRRIGATION IR SOLN
Status: DC | PRN
  Administered 2023-11-30: 500 mL

## 2023-11-30 MED ORDER — INDOCYANINE GREEN 25 MG IV SOLR
INTRAVENOUS | Status: AC
  Filled 2023-11-30: qty 10

## 2023-11-30 MED ORDER — INDOCYANINE GREEN 25 MG IV SOLR
2.5000 mg | Freq: Once | INTRAVENOUS | Status: AC
  Administered 2023-11-30: 2.5 mg via INTRAVENOUS

## 2023-11-30 MED ORDER — CEFAZOLIN SODIUM-DEXTROSE 2-4 GM/100ML-% IV SOLN
2.0000 g | INTRAVENOUS | Status: AC
  Administered 2023-11-30: 3 g via INTRAVENOUS
  Filled 2023-11-30: qty 100

## 2023-11-30 MED ORDER — LIDOCAINE 2% (20 MG/ML) 5 ML SYRINGE
INTRAMUSCULAR | Status: DC | PRN
  Administered 2023-11-30: 100 mg via INTRAVENOUS

## 2023-11-30 MED ORDER — CHLORHEXIDINE GLUCONATE 0.12 % MT SOLN: 15.0000 mL | Freq: Once | OROMUCOSAL | Status: DC

## 2023-11-30 MED ORDER — ROCURONIUM BROMIDE 10 MG/ML (PF) SYRINGE
PREFILLED_SYRINGE | INTRAVENOUS | Status: AC
  Filled 2023-11-30: qty 10

## 2023-11-30 MED ORDER — OXYCODONE HCL 5 MG PO TABS: 5.0000 mg | ORAL_TABLET | ORAL | 0 refills | Status: AC | PRN

## 2023-11-30 MED ORDER — FENTANYL CITRATE (PF) 100 MCG/2ML IJ SOLN
INTRAMUSCULAR | Status: DC | PRN
  Administered 2023-11-30 (×2): 100 ug via INTRAVENOUS

## 2023-11-30 MED ORDER — DEXAMETHASONE SOD PHOSPHATE PF 10 MG/ML IJ SOLN
INTRAMUSCULAR | Status: DC | PRN
  Administered 2023-11-30: 5 mg via INTRAVENOUS

## 2023-11-30 MED ORDER — OXYCODONE HCL 5 MG/5ML PO SOLN: 5.0000 mg | Freq: Once | ORAL | Status: DC | PRN

## 2023-11-30 MED ORDER — LACTATED RINGERS IV SOLN: INTRAVENOUS | Status: DC

## 2023-11-30 MED ORDER — LIDOCAINE 2% (20 MG/ML) 5 ML SYRINGE
INTRAMUSCULAR | Status: AC
  Filled 2023-11-30: qty 10

## 2023-11-30 MED ORDER — KETOROLAC TROMETHAMINE 30 MG/ML IJ SOLN: 30.0000 mg | Freq: Once | INTRAMUSCULAR | Status: DC

## 2023-11-30 MED ORDER — MIDAZOLAM HCL (PF) 2 MG/2ML IJ SOLN
INTRAMUSCULAR | Status: DC | PRN
  Administered 2023-11-30: 2 mg via INTRAVENOUS

## 2023-11-30 SURGICAL SUPPLY — 33 items
CAUTERY HOOK MNPLR 1.6 DVNC XI (INSTRUMENTS) ×1 IMPLANT
CHLORAPREP W/TINT 26 (MISCELLANEOUS) ×1 IMPLANT
CLIP LIGATING HEM O LOK PURPLE (MISCELLANEOUS) ×1 IMPLANT
COVER LIGHT HANDLE STERIS (MISCELLANEOUS) ×1 IMPLANT
DERMABOND ADVANCED .7 DNX12 (GAUZE/BANDAGES/DRESSINGS) ×1 IMPLANT
DRAPE ARM DVNC X/XI (DISPOSABLE) ×4 IMPLANT
DRAPE COLUMN DVNC XI (DISPOSABLE) ×1 IMPLANT
ELECTRODE REM PT RTRN 9FT ADLT (ELECTROSURGICAL) ×1 IMPLANT
FORCEPS BPLR R/ABLATION 8 DVNC (INSTRUMENTS) ×1 IMPLANT
FORCEPS PROGRASP DVNC XI (FORCEP) ×1 IMPLANT
GLOVE BIOGEL PI IND STRL 7.0 (GLOVE) ×2 IMPLANT
GLOVE SURG SS PI 7.5 STRL IVOR (GLOVE) ×2 IMPLANT
GOWN STRL REUS W/TWL LRG LVL3 (GOWN DISPOSABLE) ×3 IMPLANT
HEMOSTAT SNOW SURGICEL 2X4 (HEMOSTASIS) IMPLANT
IRRIGATOR SUCT 8 DISP DVNC XI (IRRIGATION / IRRIGATOR) IMPLANT
KIT TURNOVER KIT A (KITS) ×1 IMPLANT
MANIFOLD NEPTUNE II (INSTRUMENTS) ×1 IMPLANT
NDL HYPO 21X1.5 SAFETY (NEEDLE) ×1 IMPLANT
NDL INSUFFLATION 14GA 120MM (NEEDLE) ×1 IMPLANT
NEEDLE HYPO 21X1.5 SAFETY (NEEDLE) ×1 IMPLANT
NEEDLE INSUFFLATION 14GA 120MM (NEEDLE) ×1 IMPLANT
OBTURATOR OPTICALSTD 8 DVNC (TROCAR) ×1 IMPLANT
PACK LAP CHOLE LZT030E (CUSTOM PROCEDURE TRAY) ×1 IMPLANT
PAD ARMBOARD POSITIONER FOAM (MISCELLANEOUS) ×1 IMPLANT
PENCIL HANDSWITCHING (ELECTRODE) ×1 IMPLANT
POSITIONER HEAD 8X9X4 ADT (SOFTGOODS) ×1 IMPLANT
SEAL UNIV 5-12 XI (MISCELLANEOUS) ×4 IMPLANT
SET BASIN LINEN APH (SET/KITS/TRAYS/PACK) ×1 IMPLANT
SET TUBE SMOKE EVAC HIGH FLOW (TUBING) IMPLANT
SUT MNCRL AB 4-0 PS2 18 (SUTURE) ×2 IMPLANT
SYR 30ML LL (SYRINGE) ×1 IMPLANT
SYSTEM RETRIEVL 5MM INZII UNIV (BASKET) ×1 IMPLANT
WATER STERILE IRR 500ML POUR (IV SOLUTION) ×1 IMPLANT

## 2023-11-30 NOTE — Interval H&P Note (Signed)
 History and Physical Interval Note:  11/30/2023 7:10 AM  Melinda Giles  has presented today for surgery, with the diagnosis of GALLBLADDER POLYP.  The various methods of treatment have been discussed with the patient and family. After consideration of risks, benefits and other options for treatment, the patient has consented to  Procedure(s): CHOLECYSTECTOMY, ROBOT-ASSISTED, LAPAROSCOPIC (N/A) as a surgical intervention.  The patient's history has been reviewed, patient examined, no change in status, stable for surgery.  I have reviewed the patient's chart and labs.  Questions were answered to the patient's satisfaction.     Oneil Budge

## 2023-11-30 NOTE — Transfer of Care (Signed)
 Immediate Anesthesia Transfer of Care Note  Patient: Melinda Giles  Procedure(s) Performed: CHOLECYSTECTOMY, ROBOT-ASSISTED, LAPAROSCOPIC (Abdomen)  Patient Location: PACU  Anesthesia Type:General  Level of Consciousness: awake, alert , oriented, and patient cooperative  Airway & Oxygen Therapy: Patient Spontanous Breathing and Patient connected to face mask oxygen  Post-op Assessment: Report given to RN, Post -op Vital signs reviewed and stable, and Patient moving all extremities X 4  Post vital signs: Reviewed and stable  Last Vitals:  Vitals Value Taken Time  BP 136/91 11/30/23 08:51  Temp 97.8 0851  Pulse 82 11/30/23 08:53  Resp 22 11/30/23 08:53  SpO2 97 % 11/30/23 08:53  Vitals shown include unfiled device data.  Last Pain:  Vitals:   11/30/23 0634  TempSrc: Oral  PainSc: 0-No pain      Patients Stated Pain Goal: 6 (11/30/23 9365)  Complications: No notable events documented.

## 2023-11-30 NOTE — Op Note (Signed)
 Patient:  Melinda Giles  DOB:  Mar 16, 1970  MRN:  [id number]   Preop Diagnosis: Gallbladder polyps, history of cirrhosis  Postop Diagnosis: Same  Procedure: Robotic assisted laparoscopic cholecystectomy  Surgeon: Oneil Budge, MD  Anes: General endotracheal  Indications: Patient is a 53 year old black female with a history of cirrhosis who presents with enlarging gallbladder polyps.  The patient now presents for a robotic assisted laparoscopic cholecystectomy.  The risks and benefits of the procedure including bleeding, infection, hepatobiliary injury, and the possibility of an open procedure were fully explained to the patient, who gave informed consent.  Procedure note: The patient was placed in supine position.  After induction of general endotracheal anesthesia, the abdomen was prepped and draped using the usual sterile technique with ChloraPrep.  Surgical site confirmation was performed.  An infraumbilical incision was made down to the fascia.  A Veress needle was introduced into the abdominal cavity and confirmation of placement was done using the saline drop test.  The abdomen was then insufflated to 15 mmHg pressure.  An 8 mm trocar was introduced into the abdominal cavity under direct visualization without difficulty.  The patient was placed in reverse Trendelenburg position and additional 8 mm trocars were placed in the left upper quadrant, right lower quadrant, and right flank regions.  The robot was then docked and targeted.  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  The gallbladder was retracted in a dynamic fashion in order to provide a critical view of the triangle of Calot.  The cystic duct was first identified.  Its junction to the infundibulum was fully identified.  This was confirmed by firefly.  Hem-o-lok clips were placed proximally and distally on the cystic duct and the cystic duct was divided.  This was likewise done to the cystic artery.  The gallbladder was freed away  from the gallbladder fossa using Bovie electrocautery.  The gallbladder was delivered through the left upper quadrant trocar site using an Endo Catch bag without difficulty.  The gallbladder fossa was inspected no abnormal bleeding or bile leakage was noted.  Surgicel was placed in the gallbladder fossa.  The robot was undocked.  All fluid and air were then evacuated from the abdominal cavity prior to the removal of the trocars.  All wounds were irrigated with normal saline.  All wounds were injected with 0.5% Sensorcaine.  All incisions were closed using a 4-0 Monocryl subcuticular suture.  Dermabond was applied.  All tape and needle counts were correct at the end of the procedure.  The patient was extubated in the operating room and transferred to PACU in stable condition.    Complications: None  EBL: Minimal  Specimen: Gallbladder    Media Information  Document Information  Photos    11/30/2023 08:07  Attached To:  Hospital Encounter on 11/30/23  Source Information  Budge Oneil, MD  [facility]

## 2023-11-30 NOTE — Anesthesia Procedure Notes (Signed)
 Procedure Name: Intubation Date/Time: 11/30/2023 7:47 AM  Performed by: Cordella Elvie HERO, CRNAPre-anesthesia Checklist: Patient identified, Emergency Drugs available, Suction available, Patient being monitored and Timeout performed Patient Re-evaluated:Patient Re-evaluated prior to induction Oxygen Delivery Method: Circle system utilized Preoxygenation: Pre-oxygenation with 100% oxygen Induction Type: IV induction Ventilation: Mask ventilation without difficulty Laryngoscope Size: Mac and 3 Grade View: Grade I Tube type: Oral Tube size: 7.0 mm Number of attempts: 1 Airway Equipment and Method: Stylet Placement Confirmation: ETT inserted through vocal cords under direct vision, positive ETCO2, CO2 detector and breath sounds checked- equal and bilateral Secured at: 22 cm Tube secured with: Tape Dental Injury: Teeth and Oropharynx as per pre-operative assessment

## 2023-11-30 NOTE — Anesthesia Preprocedure Evaluation (Signed)
 Anesthesia Evaluation  Patient identified by MRN, date of birth, ID band Patient awake    Reviewed: Allergy & Precautions, H&P , NPO status , Patient's Chart, lab work & pertinent test results, reviewed documented beta blocker date and time   Airway Mallampati: II  TM Distance: >3 FB Neck ROM: full    Dental no notable dental hx.    Pulmonary neg pulmonary ROS, former smoker   Pulmonary exam normal breath sounds clear to auscultation       Cardiovascular Exercise Tolerance: Good hypertension,  Rhythm:regular Rate:Normal     Neuro/Psych Seizures -,  PSYCHIATRIC DISORDERS    Schizophrenia   Neuromuscular disease    GI/Hepatic negative GI ROS, Neg liver ROS,,,  Endo/Other  diabetes  Class 4 obesity  Renal/GU negative Renal ROS  negative genitourinary   Musculoskeletal   Abdominal   Peds  Hematology  (+) Blood dyscrasia, anemia   Anesthesia Other Findings   Reproductive/Obstetrics negative OB ROS                              Anesthesia Physical Anesthesia Plan  ASA: 3  Anesthesia Plan: General and General ETT   Post-op Pain Management:    Induction:   PONV Risk Score and Plan: Ondansetron and Scopolamine patch - Pre-op  Airway Management Planned:   Additional Equipment:   Intra-op Plan:   Post-operative Plan:   Informed Consent: I have reviewed the patients History and Physical, chart, labs and discussed the procedure including the risks, benefits and alternatives for the proposed anesthesia with the patient or authorized representative who has indicated his/her understanding and acceptance.     Dental Advisory Given  Plan Discussed with: CRNA  Anesthesia Plan Comments:         Anesthesia Quick Evaluation

## 2023-11-30 NOTE — Anesthesia Postprocedure Evaluation (Signed)
 Anesthesia Post Note  Patient: Melinda Giles  Procedure(s) Performed: CHOLECYSTECTOMY, ROBOT-ASSISTED, LAPAROSCOPIC (Abdomen)  Patient location during evaluation: Phase II Anesthesia Type: General Level of consciousness: awake Pain management: pain level controlled Vital Signs Assessment: post-procedure vital signs reviewed and stable Respiratory status: spontaneous breathing and respiratory function stable Cardiovascular status: blood pressure returned to baseline and stable Postop Assessment: no headache and no apparent nausea or vomiting Anesthetic complications: no Comments: Late entry   No notable events documented.   Last Vitals:  Vitals:   11/30/23 1015 11/30/23 1016  BP: 135/84 135/84  Pulse: 70 72  Resp: 12 17  Temp:  36.5 C  SpO2: 95% 96%    Last Pain:  Vitals:   11/30/23 1016  TempSrc: Oral  PainSc: 0-No pain                 Yvonna JINNY Bosworth

## 2023-12-01 LAB — SURGICAL PATHOLOGY

## 2023-12-07 ENCOUNTER — Other Ambulatory Visit (HOSPITAL_COMMUNITY): Payer: Self-pay

## 2023-12-07 ENCOUNTER — Ambulatory Visit: Admitting: General Surgery

## 2023-12-07 ENCOUNTER — Ambulatory Visit (INDEPENDENT_AMBULATORY_CARE_PROVIDER_SITE_OTHER): Payer: Self-pay

## 2023-12-07 VITALS — BP 119/83 | HR 103 | Ht 64.0 in | Wt 257.1 lb

## 2023-12-07 DIAGNOSIS — Z7985 Long-term (current) use of injectable non-insulin antidiabetic drugs: Secondary | ICD-10-CM | POA: Diagnosis not present

## 2023-12-07 DIAGNOSIS — Z09 Encounter for follow-up examination after completed treatment for conditions other than malignant neoplasm: Secondary | ICD-10-CM

## 2023-12-07 DIAGNOSIS — R269 Unspecified abnormalities of gait and mobility: Secondary | ICD-10-CM | POA: Diagnosis not present

## 2023-12-07 DIAGNOSIS — Z23 Encounter for immunization: Secondary | ICD-10-CM | POA: Diagnosis not present

## 2023-12-07 DIAGNOSIS — E119 Type 2 diabetes mellitus without complications: Secondary | ICD-10-CM | POA: Diagnosis not present

## 2023-12-07 MED ORDER — SEMAGLUTIDE (2 MG/DOSE) 8 MG/3ML ~~LOC~~ SOPN: 2.0000 mg | PEN_INJECTOR | SUBCUTANEOUS | 5 refills | Status: DC

## 2023-12-07 MED ORDER — SEMAGLUTIDE (2 MG/DOSE) 8 MG/3ML ~~LOC~~ SOPN
2.0000 mg | PEN_INJECTOR | SUBCUTANEOUS | 5 refills | Status: AC
  Filled 2023-12-07: qty 3, 28d supply, fill #0
  Filled 2023-12-22 – 2024-01-09 (×2): qty 3, 28d supply, fill #1
  Filled 2024-02-02: qty 3, 28d supply, fill #2

## 2023-12-07 NOTE — Progress Notes (Signed)
 Attempted postoperative virtual telephone visit.  Left message with patient to call my office should she have any problems.

## 2023-12-07 NOTE — Progress Notes (Signed)
 Established Patient Office Visit  Subjective   Patient ID: Melinda Giles, female    DOB: 1970/05/11  Age: 53 y.o. MRN: [id number]  Chief Complaint  Patient presents with   Medical Management of Chronic Issues    3 month follow up    HPI Discussed the use of AI scribe software for clinical note transcription with the patient, who gave verbal consent to proceed.  History of Present Illness    Melinda Giles is a 53 year old female who presents for follow-up after gallbladder removal and medication management. She is accompanied by her child.  Post-cholecystectomy status - Status post gallbladder removal - No current postoperative complications - Stable condition since surgery  Medication management - Resumed medication regimen after missing doses around the time of surgery - Currently taking Ozempic   Mobility impairment - Difficulty obtaining a walker due to insurance coverage issues - Medical supply companies do not accept Medicaid or Medicare - Seeking assistance in acquiring a new walker     Patient Active Problem List   Diagnosis Date Noted   Gallbladder polyp 10/11/2023   Leukocytosis 03/28/2023   Encounter for screening fecal occult blood testing 01/31/2023   Encounter for well woman exam with routine gynecological exam 01/31/2023   Depo-Provera  contraceptive status 01/31/2023   Morbid obesity (HCC) 01/21/2023   Need for influenza vaccination 10/19/2022   Elevated ferritin 09/22/2022   Essential hypertension 06/01/2022   Bilateral hip joint arthritis 06/01/2022   Type 2 diabetes mellitus (HCC) 06/01/2022   Hyperlipidemia associated with type 2 diabetes mellitus (HCC) 06/01/2022   Cognitive impairment 06/01/2022   Former tobacco use 06/01/2022   Colon cancer screening 06/01/2022   Unilateral primary osteoarthritis, left hip 03/16/2021   Unilateral primary osteoarthritis, right hip 03/16/2021   Retained orthopedic hardware 03/16/2021   Schizophrenia (HCC)     Cirrhosis of liver (HCC) 12/24/2020   Altered mental status 11/09/2020   Seizure (HCC) 11/09/2020   Vitamin D  deficiency 08/29/2019   Anemia 07/18/2019   Abnormal gait 05/30/2019   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  Constitutional:      Appearance: Normal appearance.  HENT:     Head: Normocephalic.  Eyes:     Extraocular Movements: Extraocular movements intact.     Pupils: Pupils are equal, round, and reactive to light.  Cardiovascular:     Rate and Rhythm: Normal rate and regular rhythm.  Pulmonary:     Effort: Pulmonary effort is normal.     Breath sounds: Normal breath sounds.  Musculoskeletal:     Cervical back: Normal range of motion and neck supple.  Neurological:     Mental Status: She is alert and oriented to person, place, and time.  Psychiatric:        Mood and Affect: Mood normal.        Thought  Content: Thought content normal.       The ASCVD Risk score (Arnett DK, et al., 2019) failed to calculate for the following reasons:   The valid total cholesterol range is 130 to 320 mg/dL    Assessment & Plan:   Problem List Items Addressed This Visit       Endocrine   Type 2 diabetes mellitus (HCC) - Primary   Recent weight loss post-gallbladder removal. Current management includes Ozempic , with a need to increase dosage due to weight loss. - Increased Ozempic  to 2 mg. - Checked A1c with finger prick.      Relevant Medications   Semaglutide , 2 MG/DOSE, 8 MG/3ML SOPN    Other Relevant Orders   Bayer DCA Hb A1c Waived   HgB A1c (Completed)     Other   Abnormal gait   Difficulty obtaining a walker due to issues with medical supply companies not accepting Medicaid or Medicare. - Will assist in obtaining a new walker from a medical supply company that accepts Medicaid or Medicare.      Other Visit Diagnoses       Encounter for immunization       Relevant Orders   Flu vaccine trivalent PF, 6mos and older(Flulaval,Afluria,Fluarix,Fluzone) (Completed)       Return in about 4 months (around 04/05/2024) for chronic follow-up with PCP.    Leita Longs, FNP

## 2023-12-08 ENCOUNTER — Other Ambulatory Visit: Payer: Self-pay

## 2023-12-08 LAB — HEMOGLOBIN A1C
Est. average glucose Bld gHb Est-mCnc: 134 mg/dL
Hgb A1c MFr Bld: 6.3 % — ABNORMAL HIGH (ref 4.8–5.6)

## 2023-12-09 NOTE — Assessment & Plan Note (Signed)
 Recent weight loss post-gallbladder removal. Current management includes Ozempic , with a need to increase dosage due to weight loss. - Increased Ozempic  to 2 mg. - Checked A1c with finger prick.

## 2023-12-09 NOTE — Assessment & Plan Note (Signed)
 Difficulty obtaining a walker due to issues with medical supply companies not accepting Medicaid or Medicare. - Will assist in obtaining a new walker from a medical supply company that accepts Medicaid or Medicare.

## 2023-12-22 ENCOUNTER — Other Ambulatory Visit: Payer: Self-pay | Admitting: Internal Medicine

## 2023-12-22 ENCOUNTER — Other Ambulatory Visit (HOSPITAL_COMMUNITY): Payer: Self-pay

## 2023-12-22 ENCOUNTER — Inpatient Hospital Stay: Attending: Internal Medicine

## 2023-12-22 ENCOUNTER — Other Ambulatory Visit: Payer: Self-pay

## 2023-12-22 DIAGNOSIS — E1165 Type 2 diabetes mellitus with hyperglycemia: Secondary | ICD-10-CM

## 2023-12-22 DIAGNOSIS — G621 Alcoholic polyneuropathy: Secondary | ICD-10-CM

## 2023-12-22 DIAGNOSIS — E119 Type 2 diabetes mellitus without complications: Secondary | ICD-10-CM

## 2023-12-22 DIAGNOSIS — I10 Essential (primary) hypertension: Secondary | ICD-10-CM

## 2023-12-22 DIAGNOSIS — D649 Anemia, unspecified: Secondary | ICD-10-CM | POA: Insufficient documentation

## 2023-12-22 MED ORDER — METFORMIN HCL 500 MG PO TABS
1000.0000 mg | ORAL_TABLET | Freq: Two times a day (BID) | ORAL | 1 refills | Status: AC
  Filled 2023-12-22 – 2024-01-18 (×2): qty 360, 90d supply, fill #0

## 2023-12-22 MED ORDER — LOSARTAN POTASSIUM 25 MG PO TABS
25.0000 mg | ORAL_TABLET | Freq: Every day | ORAL | 1 refills | Status: AC
  Filled 2023-12-22: qty 90, 90d supply, fill #0

## 2023-12-23 ENCOUNTER — Other Ambulatory Visit: Payer: Self-pay

## 2023-12-23 ENCOUNTER — Inpatient Hospital Stay

## 2023-12-23 ENCOUNTER — Other Ambulatory Visit (HOSPITAL_COMMUNITY): Payer: Self-pay

## 2023-12-23 DIAGNOSIS — D72829 Elevated white blood cell count, unspecified: Secondary | ICD-10-CM

## 2023-12-23 DIAGNOSIS — D649 Anemia, unspecified: Secondary | ICD-10-CM | POA: Diagnosis not present

## 2023-12-23 DIAGNOSIS — G621 Alcoholic polyneuropathy: Secondary | ICD-10-CM | POA: Diagnosis present

## 2023-12-23 LAB — COMPREHENSIVE METABOLIC PANEL WITH GFR
ALT: 21 U/L (ref 0–44)
AST: 26 U/L (ref 15–41)
Albumin: 3.9 g/dL (ref 3.5–5.0)
Alkaline Phosphatase: 68 U/L (ref 38–126)
Anion gap: 12 (ref 5–15)
BUN: 6 mg/dL (ref 6–20)
CO2: 26 mmol/L (ref 22–32)
Calcium: 9.6 mg/dL (ref 8.9–10.3)
Chloride: 101 mmol/L (ref 98–111)
Creatinine, Ser: 0.77 mg/dL (ref 0.44–1.00)
GFR, Estimated: >60 mL/min (ref >60)
Glucose, Bld: 133 mg/dL — ABNORMAL HIGH (ref 70–99)
Potassium: 4 mmol/L (ref 3.5–5.1)
Sodium: 140 mmol/L (ref 135–145)
Total Bilirubin: 0.9 mg/dL (ref 0.0–1.2)
Total Protein: 8 g/dL (ref 6.5–8.1)

## 2023-12-23 LAB — CBC WITH DIFFERENTIAL/PLATELET
Abs Immature Granulocytes: 0.02 K/uL (ref 0.00–0.07)
Basophils Absolute: 0.1 K/uL (ref 0.0–0.1)
Basophils Relative: 1 %
Eosinophils Absolute: 0.2 K/uL (ref 0.0–0.5)
Eosinophils Relative: 2 %
HCT: 38.9 % (ref 36.0–46.0)
Hemoglobin: 12.9 g/dL (ref 12.0–15.0)
Immature Granulocytes: 0 %
Lymphocytes Relative: 32 %
Lymphs Abs: 3.3 K/uL (ref 0.7–4.0)
MCH: 30.2 pg (ref 26.0–34.0)
MCHC: 33.2 g/dL (ref 30.0–36.0)
MCV: 91.1 fL (ref 80.0–100.0)
Monocytes Absolute: 0.5 K/uL (ref 0.1–1.0)
Monocytes Relative: 5 %
Neutro Abs: 6.4 K/uL (ref 1.7–7.7)
Neutrophils Relative %: 60 %
Platelets: 441 K/uL — ABNORMAL HIGH (ref 150–400)
RBC: 4.27 MIL/uL (ref 3.87–5.11)
RDW: 13.3 % (ref 11.5–15.5)
WBC: 10.4 K/uL (ref 4.0–10.5)
nRBC: 0 % (ref 0.0–0.2)

## 2023-12-23 LAB — C-REACTIVE PROTEIN: CRP: 0.7 mg/dL (ref <1.0)

## 2023-12-23 LAB — SEDIMENTATION RATE: Sed Rate: 35 mm/h — ABNORMAL HIGH (ref 0–30)

## 2023-12-23 LAB — FERRITIN: Ferritin: 238 ng/mL (ref 11–307)

## 2023-12-23 LAB — URIC ACID: Uric Acid, Serum: 7.5 mg/dL — ABNORMAL HIGH (ref 2.5–7.1)

## 2023-12-24 ENCOUNTER — Other Ambulatory Visit (HOSPITAL_COMMUNITY): Payer: Self-pay

## 2023-12-26 ENCOUNTER — Other Ambulatory Visit (HOSPITAL_COMMUNITY): Payer: Self-pay

## 2023-12-26 ENCOUNTER — Other Ambulatory Visit: Payer: Self-pay

## 2023-12-26 MED ORDER — PREGABALIN 150 MG PO CAPS
150.0000 mg | ORAL_CAPSULE | Freq: Two times a day (BID) | ORAL | 0 refills | Status: DC
  Filled 2023-12-26: qty 60, 30d supply, fill #0

## 2023-12-27 ENCOUNTER — Other Ambulatory Visit: Payer: Self-pay | Admitting: *Deleted

## 2023-12-27 ENCOUNTER — Other Ambulatory Visit (HOSPITAL_COMMUNITY): Payer: Self-pay

## 2023-12-27 NOTE — Patient Instructions (Signed)
 Visit Information  Thank you for taking time to visit with me today. Please don't hesitate to contact me if I can be of assistance to you before our next scheduled appointment.  Your next care management appointment is by telephone on 01-27-2024 at 1:30 pm  Telephone follow-up in 1 month  Please call the care guide team at [phone] if you need to cancel, schedule, or reschedule an appointment.   Please call the Suicide and Crisis Lifeline: 988 call the [place]  National Suicide Prevention Lifeline: (612)622-1855 or TTY: 502-174-8176 TTY 510-441-9529) to talk to a trained counselor call 1-800-273-TALK (toll free, 24 hour hotline) if you are experiencing a Mental Health or Behavioral Health Crisis or need someone to talk to.  Rosina Forte, BSN RN [facility], [facility] RN Care Manager Direct Dial: [phone]  Fax: [phone]
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
## 2023-12-28 ENCOUNTER — Other Ambulatory Visit: Payer: Self-pay

## 2023-12-28 DIAGNOSIS — E1165 Type 2 diabetes mellitus with hyperglycemia: Secondary | ICD-10-CM

## 2023-12-28 MED ORDER — LANCET DEVICE MISC: 1.0000 | 0 refills | Status: AC

## 2023-12-28 MED ORDER — BLOOD GLUCOSE TEST VI STRP: 1.0000 | ORAL_STRIP | 11 refills | Status: AC

## 2023-12-28 MED ORDER — BLOOD GLUCOSE MONITORING SUPPL DEVI: 1.0000 | 0 refills | Status: AC

## 2023-12-28 MED ORDER — LANCETS MISC: 1.0000 | 11 refills | Status: AC

## 2023-12-29 ENCOUNTER — Inpatient Hospital Stay: Admitting: Oncology

## 2024-01-09 ENCOUNTER — Other Ambulatory Visit (HOSPITAL_COMMUNITY): Payer: Self-pay

## 2024-01-16 ENCOUNTER — Encounter: Payer: Self-pay | Admitting: *Deleted

## 2024-01-18 ENCOUNTER — Other Ambulatory Visit (HOSPITAL_COMMUNITY): Payer: Self-pay

## 2024-01-18 ENCOUNTER — Other Ambulatory Visit: Payer: Self-pay

## 2024-01-18 DIAGNOSIS — G621 Alcoholic polyneuropathy: Secondary | ICD-10-CM

## 2024-01-20 ENCOUNTER — Other Ambulatory Visit (HOSPITAL_COMMUNITY): Payer: Self-pay

## 2024-01-20 MED ORDER — PREGABALIN 150 MG PO CAPS
150.0000 mg | ORAL_CAPSULE | Freq: Two times a day (BID) | ORAL | 0 refills | Status: AC
  Filled 2024-01-20 – 2024-01-24 (×2): qty 60, 30d supply, fill #0

## 2024-01-21 ENCOUNTER — Other Ambulatory Visit (HOSPITAL_COMMUNITY): Payer: Self-pay

## 2024-01-24 ENCOUNTER — Other Ambulatory Visit: Payer: Self-pay

## 2024-01-27 ENCOUNTER — Other Ambulatory Visit: Payer: Self-pay | Admitting: *Deleted
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
## 2024-01-27 NOTE — Patient Instructions (Signed)
 Visit Information  Thank you for taking time to visit with me today. Please don't hesitate to contact me if I can be of assistance to you before our next scheduled appointment.  Your next care management appointment is no further scheduled appointments.     Please call the care guide team at [phone] if you need to cancel, schedule, or reschedule an appointment.   Please call the Suicide and Crisis Lifeline: 988 call the [place]  National Suicide Prevention Lifeline: (432)840-4152 or TTY: (848) 715-2915 TTY 307-218-8017) to talk to a trained counselor call 1-800-273-TALK (toll free, 24 hour hotline) if you are experiencing a Mental Health or Behavioral Health Crisis or need someone to talk to.  Rosina Forte, BSN RN [facility], [facility] Health RN Care Manager Direct Dial: [phone]  Fax: [phone]

## 2024-02-02 ENCOUNTER — Other Ambulatory Visit (HOSPITAL_COMMUNITY): Payer: Self-pay

## 2024-04-09 ENCOUNTER — Ambulatory Visit

## 2024-05-07 ENCOUNTER — Ambulatory Visit
